# Patient Record
Sex: Female | Born: 1960 | Race: White | Hispanic: No | Marital: Single | Smoking: Current every day smoker
Health system: Southern US, Community
[De-identification: ages and names within clinical notes are randomized; demographics above are authoritative.]

## PROBLEM LIST (undated history)

## (undated) DIAGNOSIS — I10 Essential (primary) hypertension: Secondary | ICD-10-CM

## (undated) DIAGNOSIS — I82409 Acute embolism and thrombosis of unspecified deep veins of unspecified lower extremity: Secondary | ICD-10-CM

## (undated) DIAGNOSIS — M5416 Radiculopathy, lumbar region: Secondary | ICD-10-CM

## (undated) DIAGNOSIS — J449 Chronic obstructive pulmonary disease, unspecified: Secondary | ICD-10-CM

## (undated) DIAGNOSIS — M549 Dorsalgia, unspecified: Secondary | ICD-10-CM

## (undated) DIAGNOSIS — K047 Periapical abscess without sinus: Secondary | ICD-10-CM

## (undated) DIAGNOSIS — M1990 Unspecified osteoarthritis, unspecified site: Secondary | ICD-10-CM

## (undated) DIAGNOSIS — E119 Type 2 diabetes mellitus without complications: Secondary | ICD-10-CM

## (undated) DIAGNOSIS — G8929 Other chronic pain: Secondary | ICD-10-CM

## (undated) DIAGNOSIS — J45909 Unspecified asthma, uncomplicated: Secondary | ICD-10-CM

## (undated) DIAGNOSIS — F4321 Adjustment disorder with depressed mood: Secondary | ICD-10-CM

## (undated) DIAGNOSIS — E785 Hyperlipidemia, unspecified: Secondary | ICD-10-CM

## (undated) DIAGNOSIS — G4733 Obstructive sleep apnea (adult) (pediatric): Secondary | ICD-10-CM

## (undated) DIAGNOSIS — G709 Myoneural disorder, unspecified: Secondary | ICD-10-CM

## (undated) HISTORY — PX: FRACTURE SURGERY: SHX138

## (undated) HISTORY — DX: Acute embolism and thrombosis of unspecified deep veins of unspecified lower extremity: I82.409

## (undated) HISTORY — DX: Chronic obstructive pulmonary disease, unspecified: J44.9

## (undated) HISTORY — DX: Unspecified asthma, uncomplicated: J45.909

## (undated) HISTORY — DX: Obstructive sleep apnea (adult) (pediatric): G47.33

## (undated) HISTORY — PX: EXPLORATORY LAPAROTOMY: SUR591

## (undated) HISTORY — DX: Hyperlipidemia, unspecified: E78.5

## (undated) HISTORY — DX: Myoneural disorder, unspecified: G70.9

## (undated) HISTORY — DX: Adjustment disorder with depressed mood: F43.21

## (undated) HISTORY — DX: Type 2 diabetes mellitus without complications: E11.9

## (undated) HISTORY — PX: SPINE SURGERY: SHX786

## (undated) HISTORY — DX: Unspecified osteoarthritis, unspecified site: M19.90

## (undated) HISTORY — DX: Essential (primary) hypertension: I10

---

## 2008-11-27 HISTORY — PX: SHOULDER SURGERY: SHX246

## 2013-11-27 HISTORY — PX: BACK SURGERY: SHX140

## 2017-07-10 ENCOUNTER — Ambulatory Visit: Payer: Self-pay | Admitting: Family Medicine

## 2017-09-07 ENCOUNTER — Ambulatory Visit: Payer: Self-pay | Admitting: Family Medicine

## 2017-10-04 ENCOUNTER — Other Ambulatory Visit: Payer: Self-pay

## 2017-10-04 ENCOUNTER — Other Ambulatory Visit: Payer: Self-pay | Admitting: Family Medicine

## 2017-10-04 ENCOUNTER — Encounter: Payer: Self-pay | Admitting: Family Medicine

## 2017-10-04 ENCOUNTER — Ambulatory Visit (INDEPENDENT_AMBULATORY_CARE_PROVIDER_SITE_OTHER): Payer: Medicare Other | Admitting: Family Medicine

## 2017-10-04 VITALS — BP 120/82 | HR 72 | Temp 98.5°F | Resp 18 | Ht 66.0 in | Wt 243.0 lb

## 2017-10-04 DIAGNOSIS — F4321 Adjustment disorder with depressed mood: Secondary | ICD-10-CM

## 2017-10-04 DIAGNOSIS — M47816 Spondylosis without myelopathy or radiculopathy, lumbar region: Secondary | ICD-10-CM | POA: Insufficient documentation

## 2017-10-04 DIAGNOSIS — Z23 Encounter for immunization: Secondary | ICD-10-CM

## 2017-10-04 DIAGNOSIS — R7303 Prediabetes: Secondary | ICD-10-CM | POA: Diagnosis not present

## 2017-10-04 DIAGNOSIS — E785 Hyperlipidemia, unspecified: Secondary | ICD-10-CM | POA: Insufficient documentation

## 2017-10-04 DIAGNOSIS — E66812 Obesity, class 2: Secondary | ICD-10-CM | POA: Insufficient documentation

## 2017-10-04 DIAGNOSIS — Z6839 Body mass index (BMI) 39.0-39.9, adult: Secondary | ICD-10-CM | POA: Diagnosis not present

## 2017-10-04 DIAGNOSIS — IMO0002 Reserved for concepts with insufficient information to code with codable children: Secondary | ICD-10-CM | POA: Insufficient documentation

## 2017-10-04 DIAGNOSIS — M5416 Radiculopathy, lumbar region: Secondary | ICD-10-CM | POA: Diagnosis not present

## 2017-10-04 DIAGNOSIS — M4726 Other spondylosis with radiculopathy, lumbar region: Secondary | ICD-10-CM

## 2017-10-04 DIAGNOSIS — I10 Essential (primary) hypertension: Secondary | ICD-10-CM

## 2017-10-04 DIAGNOSIS — E559 Vitamin D deficiency, unspecified: Secondary | ICD-10-CM

## 2017-10-04 DIAGNOSIS — Z720 Tobacco use: Secondary | ICD-10-CM | POA: Insufficient documentation

## 2017-10-04 DIAGNOSIS — Z86718 Personal history of other venous thrombosis and embolism: Secondary | ICD-10-CM

## 2017-10-04 DIAGNOSIS — G8929 Other chronic pain: Secondary | ICD-10-CM

## 2017-10-04 DIAGNOSIS — E1165 Type 2 diabetes mellitus with hyperglycemia: Secondary | ICD-10-CM | POA: Insufficient documentation

## 2017-10-04 DIAGNOSIS — E6609 Other obesity due to excess calories: Secondary | ICD-10-CM | POA: Insufficient documentation

## 2017-10-04 HISTORY — DX: Adjustment disorder with depressed mood: F43.21

## 2017-10-04 MED ORDER — DULOXETINE HCL 60 MG PO CPEP: 60.0000 mg | ORAL_CAPSULE | Freq: Every day | ORAL | 2 refills | Status: DC

## 2017-10-04 MED ORDER — TIZANIDINE HCL 4 MG PO CAPS: 4.0000 mg | ORAL_CAPSULE | Freq: Three times a day (TID) | ORAL | 1 refills | Status: DC

## 2017-10-04 MED ORDER — LOSARTAN POTASSIUM 100 MG PO TABS: 100.0000 mg | ORAL_TABLET | Freq: Every day | ORAL | 2 refills | Status: DC

## 2017-10-04 MED ORDER — PREGABALIN 100 MG PO CAPS: 100.0000 mg | ORAL_CAPSULE | Freq: Three times a day (TID) | ORAL | 2 refills | Status: DC

## 2017-10-04 MED ORDER — RIVAROXABAN 20 MG PO TABS: 20.0000 mg | ORAL_TABLET | Freq: Every day | ORAL | 2 refills | Status: DC

## 2017-10-04 NOTE — Patient Instructions (Addendum)
Need old records from PCP:  Dr Chauncey Cruel. [facility] in [place] Need old records from [place] [facility]  Need blood work today I will send you a letter with your test results.  If there is anything of concern, we will call right away.  I have refilled your usual medicines We will refer you to a pain specialist

## 2017-10-04 NOTE — Progress Notes (Signed)
Chief Complaint  Patient presents with  . Hypertension   Patient is new to establish. She is out of all of her medications. She states she was previously diagnosed as diabetic.  She took metformin for a while.  She thinks it made her feel bad.  She stopped her metformin on her own.  She denies polydipsia or polyuria. She states that she has hypertension.  She is out of her Cozaar.  Her blood pressure has been fairly well controlled. She has chronic pain.  She was on oxycodone, tizanidine, and Lyrica on a daily basis.  She has not had her pain medication for couple of months.  She is requesting a refill today.  She is informed that I do not do chronic narcotic pain management and would refer her for this.  She states that she has chronic back pain from having surgery, postoperatively she developed infection at the surgical site in her low back, and had to have IV antibiotics for months.  She states that because of this her operation was not successful she still has back pain and leg pain. Patient is obese.  She feels unable to exercise because of her back pain. 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  She was worked up for cancer 2 years ago and this was negative.  Her first DVT was in her leg, the second 1 was on her arm.  She is on lifetime Xarelto because of this.  Because of the Xarelto she cannot take anti-inflammatory medications. I have discussed the multiple health risks associated with cigarette smoking including, but not limited to, cardiovascular disease, lung disease and cancer.  I have strongly recommended that smoking be stopped.  I have reviewed the various methods of quitting including cold [place], classes, nicotine replacements and prescription medications.  I have offered assistance in this difficult process.  The patient is not interested in assistance at this time.  She believes that she has failed all treatment options that have been offered to her in the past, was intolerant of Chantix,  and none of the patches gum or nicotine replacement have helped her.  She is uncertain if she is up-to-date with her health care maintenance.  She states she had a colonoscopy a couple years ago.  I will request her old records.  She agrees to getting a flu shot today.  She believes she has had a pneumonia shot in the past.  Patient Active Problem List   Diagnosis Date Noted  . Tobacco abuse 10/04/2017  . Situational depression 10/04/2017  . HTN (hypertension) 10/04/2017  . HLD (hyperlipidemia) 10/04/2017  . Prediabetes 10/04/2017  . Class 2 obesity due to excess calories with body mass index (BMI) of 39.0 to 39.9 in adult 10/04/2017  . Chronic radicular low back pain 10/04/2017  . 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 History of DVT of lower extremity 10/04/2017    Outpatient Encounter Medications as of 10/04/2017  Medication Sig  . DULoxetine (CYMBALTA) 60 MG capsule Take 1 capsule (60 mg total) daily by mouth.  . losartan (COZAAR) 100 MG tablet Take 1 tablet (100 mg total) daily by mouth.  . Oxycodone HCl 10 MG TABS Take 10 mg 4 (four) times daily by mouth.  . pregabalin (LYRICA) 100 MG capsule Take 1 capsule (100 mg total) 3 (three) times daily by mouth.  . rivaroxaban (XARELTO) 20 MG TABS tablet Take 1 tablet (20 mg total) daily with supper by mouth.  Marland Kitchen tiZANidine (  ZANAFLEX) 4 MG capsule Take 1 capsule (4 mg total) 3 (three) times daily by mouth.   No facility-administered encounter medications on file as of 10/04/2017.     Past Medical History:  Diagnosis Date  . Arthritis   . Asthma   . COPD (chronic obstructive pulmonary disease) ([place])   . Diabetes mellitus without complication ([place])   . DVT, lower extremity ([place])   . Hyperlipidemia   . Hypertension   . Neuromuscular disorder ([place])   . Situational depression 10/04/2017    Past Surgical History:  Procedure Laterality Date  . BACK SURGERY  2015  . EXPLORATORY LAPAROTOMY    . FRACTURE SURGERY     . SHOULDER SURGERY  2010   right  . SPINE SURGERY     LUMBAR SPINE    Social History   Socioeconomic History  . Marital status: Soil scientist    Spouse name: tim  . Number of children: 1  . Years of education: 43  . Highest education level: Not on file  Social Needs  . Financial resource strain: Somewhat hard  . Food insecurity - worry: Sometimes true  . Food insecurity - inability: Never true  . Transportation needs - medical: Yes  . Transportation needs - non-medical: Yes  Occupational History  . Occupation: disability    Comment: low back  Tobacco Use  . Smoking status: Current Every Day Smoker    Packs/day: 1.00    Types: Cigarettes    Start date: 11/27/1978  . Smokeless tobacco: Never Used  Substance and Sexual Activity  . Alcohol use: No    Frequency: Never  . Drug use: No  . Sexual activity: Not Currently  Other Topics Concern  . Not on file  Social History Narrative   Lives with Octavia Bruckner and disabled son Marylyn Ishihara   Chronic pain disorder after back surgery    Family History  Problem Relation Age of Onset  . Alcohol abuse Mother   . Arthritis Mother   . Mental illness Mother   . Diabetes Father   . Heart disease Father 51  . Heart disease Brother 1  . Learning disabilities Son   . Tuberous sclerosis Son     Review of Systems  Constitutional: Positive for malaise/fatigue. Negative for chills, fever and weight loss.  HENT: Negative for congestion and hearing loss.   Eyes: Negative for blurred vision and pain.  Respiratory: Positive for cough and shortness of breath.   Cardiovascular: Negative for chest pain and leg swelling.  Gastrointestinal: Negative for abdominal pain, constipation, diarrhea and heartburn.  Genitourinary: Negative for dysuria and frequency.  Musculoskeletal: Positive for back pain, joint pain, myalgias and neck pain. Negative for falls.  Neurological: Positive for weakness. Negative for dizziness, seizures and headaches.    Endo/Heme/Allergies: Negative for polydipsia.  Psychiatric/Behavioral: Positive for depression. The patient is nervous/anxious and has insomnia.     BP 120/82 (BP Location: Right Arm, Patient Position: Sitting, Cuff Size: Normal)   Pulse 72   Temp 98.5 F (36.9 C) (Temporal)   Resp 18   Ht 5\' 6"  (1.676 m)   Wt 243 lb 0.6 oz (110.2 kg)   SpO2 99%   BMI 39.23 kg/m   Physical Exam  Constitutional: She is oriented to person, place, and time. She appears well-developed and well-nourished.  Obese.  Very demonstrative with frequent position changes, and vocalization due to back pain  HENT:  Head: Normocephalic and atraumatic.  Mouth/Throat: Oropharynx is clear and moist.  Eyes: Conjunctivae  are normal. Pupils are equal, round, and reactive to light.  Neck: Normal range of motion. Neck supple. No thyromegaly present.  Cardiovascular: Normal rate, regular rhythm and normal heart sounds.  Pulmonary/Chest: Effort normal and breath sounds normal. No respiratory distress.  Abdominal: Soft. Bowel sounds are normal.  Musculoskeletal: Normal range of motion. She exhibits no edema.  Antalgic gait.  Stiff movements.  Slightly flexed posture.  Refuses range of motion testing.  She can stand on heels and toes.  She pulls away and vocalizes with even light touch throughout the low back muscle and bony structures.  No pain with axial loading.  Pain with passive movement.  Lymphadenopathy:    She has no cervical adenopathy.  Neurological: She is alert and oriented to person, place, and time. She displays normal reflexes. Coordination normal.  Gait normal  Skin: Skin is warm and dry.  Psychiatric: She has a normal mood and affect. Thought content normal.  Evidence of symptom magnification  Nursing note and vitals reviewed. ASSESSMENT/PLAN:  1. Tobacco abuse Advised smoking cessation  2. Situational depression Refilled duloxetine  3. Essential hypertension Refilled Cozaar  4. Hyperlipidemia,  unspecified hyperlipidemia type Refilled atorvastatin - Lipid panel  5. Prediabetes We will check laboratory work and restart the metformin if needed - CBC - COMPLETE METABOLIC PANEL WITH GFR - Hemoglobin A1c - Urinalysis, Routine w reflex microscopic  6. Class 2 severe obesity due to excess calories with serious comorbidity and body mass index (BMI) of 39.0 to 39.9 in adult [facility]) Discussed importance of diet and exercise to improve her weight, and take strain off of her back.  This will also help her blood pressure and diabetes conditions  7. Chronic radicular low back pain Refer to pain specialty  8. Osteoarthritis of spine with radiculopathy, lumbar region History  9. History of DVT of lower extremity Refill Xarelto  10. Needs flu shot Done - Flu Vaccine QUAD 36+ mos IM  11. Vitamin D deficiency - VITAMIN D 25 Hydroxy (Vit-D Deficiency, Fractures)  12. Need for pneumococcal vaccination Done - Pneumococcal conjugate vaccine 13-valent IM   Patient Instructions  Need old records from PCP:  Dr Chauncey Cruel. [place] in [place] Need old records from [facility]  Need blood work today I will send you a letter with your test results.  If there is anything of concern, we will call right away.  I have refilled your usual medicines We will refer you to a pain specialist    Raylene Everts, MD

## 2017-10-09 ENCOUNTER — Encounter: Payer: Self-pay | Admitting: Family Medicine

## 2017-10-09 ENCOUNTER — Other Ambulatory Visit: Payer: Self-pay | Admitting: Family Medicine

## 2017-10-09 ENCOUNTER — Telehealth: Payer: Self-pay | Admitting: Family Medicine

## 2017-10-09 LAB — CBC
HEMATOCRIT: 35.3 % (ref 35.0–45.0)
HEMOGLOBIN: 11.3 g/dL — AB (ref 11.7–15.5)
MCH: 25.5 pg — ABNORMAL LOW (ref 27.0–33.0)
MCHC: 32 g/dL (ref 32.0–36.0)
MCV: 79.5 fL — ABNORMAL LOW (ref 80.0–100.0)
MPV: 10.1 fL (ref 7.5–12.5)
Platelets: 417 10*3/uL — ABNORMAL HIGH (ref 140–400)
RBC: 4.44 10*6/uL (ref 3.80–5.10)
RDW: 16.4 % — ABNORMAL HIGH (ref 11.0–15.0)
WBC: 8.5 10*3/uL (ref 3.8–10.8)

## 2017-10-09 LAB — HEMOGLOBIN A1C
HEMOGLOBIN A1C: 11.6 %{Hb} — AB (ref <5.7)
Mean Plasma Glucose: 286 (calc)
eAG (mmol/L): 15.9 (calc)

## 2017-10-09 LAB — URINALYSIS, ROUTINE W REFLEX MICROSCOPIC
BILIRUBIN URINE: NEGATIVE
Bacteria, UA: NONE SEEN /HPF
HGB URINE DIPSTICK: NEGATIVE
Hyaline Cast: NONE SEEN /LPF
KETONES UR: NEGATIVE
LEUKOCYTES UA: NEGATIVE
NITRITE: NEGATIVE
RBC / HPF: NONE SEEN /HPF (ref 0–2)
Specific Gravity, Urine: 1.02 (ref 1.001–1.03)
pH: <=5 (ref 5.0–8.0)

## 2017-10-09 LAB — COMPLETE METABOLIC PANEL WITH GFR
AG RATIO: 0.8 (calc) — AB (ref 1.0–2.5)
ALKALINE PHOSPHATASE (APISO): 91 U/L (ref 33–130)
ALT: 7 U/L (ref 6–29)
AST: 11 U/L (ref 10–35)
Albumin: 3.4 g/dL — ABNORMAL LOW (ref 3.6–5.1)
BUN: 8 mg/dL (ref 7–25)
CO2: 24 mmol/L (ref 20–32)
Calcium: 8.8 mg/dL (ref 8.6–10.4)
Chloride: 102 mmol/L (ref 98–110)
Creat: 0.53 mg/dL (ref 0.50–1.05)
GFR, Est African American: 125 mL/min/{1.73_m2} (ref >=60)
GFR, Est Non African American: 108 mL/min/{1.73_m2} (ref >=60)
GLOBULIN: 4.1 g/dL — AB (ref 1.9–3.7)
Glucose, Bld: 340 mg/dL — ABNORMAL HIGH (ref 65–139)
Potassium: 4.3 mmol/L (ref 3.5–5.3)
SODIUM: 136 mmol/L (ref 135–146)
Total Bilirubin: 0.3 mg/dL (ref 0.2–1.2)
Total Protein: 7.5 g/dL (ref 6.1–8.1)

## 2017-10-09 LAB — LIPID PANEL
Cholesterol: 183 mg/dL (ref <200)
HDL: 28 mg/dL — ABNORMAL LOW (ref >50)
LDL Cholesterol (Calc): 119 mg/dL (calc) — ABNORMAL HIGH
NON-HDL CHOLESTEROL (CALC): 155 mg/dL — AB (ref <130)
TRIGLYCERIDES: 249 mg/dL — AB (ref <150)
Total CHOL/HDL Ratio: 6.5 (calc) — ABNORMAL HIGH (ref <5.0)

## 2017-10-09 LAB — VITAMIN D 25 HYDROXY (VIT D DEFICIENCY, FRACTURES): Vit D, 25-Hydroxy: 9 ng/mL — ABNORMAL LOW (ref 30–100)

## 2017-10-09 MED ORDER — ATORVASTATIN CALCIUM 40 MG PO TABS: 40.0000 mg | ORAL_TABLET | Freq: Every day | ORAL | 3 refills | Status: DC

## 2017-10-09 MED ORDER — METFORMIN HCL ER 750 MG PO TB24: 750.0000 mg | ORAL_TABLET | Freq: Two times a day (BID) | ORAL | 6 refills | Status: DC

## 2017-10-09 MED ORDER — VITAMIN D (ERGOCALCIFEROL) 1.25 MG (50000 UNIT) PO CAPS: 50000.0000 [IU] | ORAL_CAPSULE | ORAL | 0 refills | Status: DC

## 2017-10-09 NOTE — Telephone Encounter (Signed)
PATIENT CALLED IN THIS MORNING STATING THAT THERE WAS A MIXUP WITH HER LABS, SHE IS REQUESTING AN UPDATE ON THE MEDICATION REQUEST FOR MUSCLE RELAXER. CB# [phone]

## 2017-10-09 NOTE — Progress Notes (Signed)
Called and spoke to Linda Gonzalez, aware of lab results and new meds.

## 2017-10-09 NOTE — Telephone Encounter (Signed)
Done

## 2017-10-09 NOTE — Telephone Encounter (Signed)
-----   Message from Raylene Everts, MD sent at 10/09/2017  9:37 AM EST ----- Call Rodena Piety.  Diabetes is significant.  Needs metformin BID and lipitor.  I have sent to pharmacy.  Needs to modify diet - STOP SWEETS.  See me as scheduled.  Letter sent

## 2017-10-10 ENCOUNTER — Encounter: Payer: Self-pay | Admitting: Family Medicine

## 2017-10-10 ENCOUNTER — Other Ambulatory Visit: Payer: Self-pay | Admitting: *Deleted

## 2017-10-10 DIAGNOSIS — K2950 Unspecified chronic gastritis without bleeding: Secondary | ICD-10-CM | POA: Insufficient documentation

## 2017-10-10 DIAGNOSIS — E559 Vitamin D deficiency, unspecified: Secondary | ICD-10-CM | POA: Insufficient documentation

## 2017-10-10 DIAGNOSIS — D126 Benign neoplasm of colon, unspecified: Secondary | ICD-10-CM | POA: Insufficient documentation

## 2017-10-10 DIAGNOSIS — G4733 Obstructive sleep apnea (adult) (pediatric): Secondary | ICD-10-CM | POA: Insufficient documentation

## 2017-10-10 HISTORY — DX: Obstructive sleep apnea (adult) (pediatric): G47.33

## 2017-10-10 MED ORDER — CYCLOBENZAPRINE HCL 5 MG PO TABS: 5.0000 mg | ORAL_TABLET | Freq: Three times a day (TID) | ORAL | 0 refills | Status: DC | PRN

## 2017-10-10 MED ORDER — METHOCARBAMOL 500 MG PO TABS: 500.0000 mg | ORAL_TABLET | Freq: Three times a day (TID) | ORAL | 0 refills | Status: DC | PRN

## 2017-10-10 NOTE — Telephone Encounter (Signed)
As we discussed yesterday, robaxin 500 TID give her #[phone]

## 2017-10-10 NOTE — Telephone Encounter (Signed)
Patient called wanting to know why she is on vitamin D and her muscle relaxer called in. Please advise [phone].

## 2017-10-10 NOTE — Telephone Encounter (Signed)
What muscle relaxer do you want her to have.

## 2017-10-10 NOTE — Telephone Encounter (Signed)
Spoke to patient. Expalined why she had vit d supplement called in. She thinks Robaxin is being denied by insurance.

## 2017-11-02 ENCOUNTER — Other Ambulatory Visit: Payer: Self-pay

## 2017-11-02 ENCOUNTER — Encounter: Payer: Self-pay | Admitting: Family Medicine

## 2017-11-02 ENCOUNTER — Ambulatory Visit (INDEPENDENT_AMBULATORY_CARE_PROVIDER_SITE_OTHER): Payer: Medicare Other | Admitting: Family Medicine

## 2017-11-02 VITALS — BP 124/86 | HR 92 | Temp 96.5°F | Resp 18 | Ht 66.0 in | Wt 246.1 lb

## 2017-11-02 DIAGNOSIS — F4321 Adjustment disorder with depressed mood: Secondary | ICD-10-CM | POA: Diagnosis not present

## 2017-11-02 DIAGNOSIS — Z1159 Encounter for screening for other viral diseases: Secondary | ICD-10-CM | POA: Diagnosis not present

## 2017-11-02 DIAGNOSIS — I10 Essential (primary) hypertension: Secondary | ICD-10-CM | POA: Diagnosis not present

## 2017-11-02 DIAGNOSIS — Z114 Encounter for screening for human immunodeficiency virus [HIV]: Secondary | ICD-10-CM | POA: Diagnosis not present

## 2017-11-02 DIAGNOSIS — E785 Hyperlipidemia, unspecified: Secondary | ICD-10-CM | POA: Diagnosis not present

## 2017-11-02 DIAGNOSIS — G8929 Other chronic pain: Secondary | ICD-10-CM | POA: Diagnosis not present

## 2017-11-02 DIAGNOSIS — M5416 Radiculopathy, lumbar region: Secondary | ICD-10-CM | POA: Diagnosis not present

## 2017-11-02 DIAGNOSIS — E1165 Type 2 diabetes mellitus with hyperglycemia: Secondary | ICD-10-CM

## 2017-11-02 MED ORDER — CYCLOBENZAPRINE HCL 5 MG PO TABS: 5.0000 mg | ORAL_TABLET | Freq: Three times a day (TID) | ORAL | 0 refills | Status: DC | PRN

## 2017-11-02 MED ORDER — BLOOD GLUCOSE MONITOR KIT: PACK | 0 refills | Status: DC

## 2017-11-02 NOTE — Patient Instructions (Addendum)
Continue to try to eat well. Exercise every day that you are able. Continue current medications. Need lab work in February Come see me after lab testing  I have placed referral to pain clinic.  We will try to get you in at the earliest available appointment  Try to quit smoking  Diabetes Mellitus and Food It is important for you to manage your blood sugar (glucose) level. Your blood glucose level can be greatly affected by what you eat. Eating healthier foods in the appropriate amounts throughout the day at about the same time each day will help you control your blood glucose level. It can also help slow or prevent worsening of your diabetes mellitus. Healthy eating may even help you improve the level of your blood pressure and reach or maintain a healthy weight. General recommendations for healthful eating and cooking habits include:  Eating meals and snacks regularly. Avoid going long periods of time without eating to lose weight.  Eating a diet that consists mainly of plant-based foods, such as fruits, vegetables, nuts, legumes, and whole grains.  Using low-heat cooking methods, such as baking, instead of high-heat cooking methods, such as deep frying.  Work with your dietitian to make sure you understand how to use the Nutrition Facts information on food labels. How can food affect me? Carbohydrates Carbohydrates affect your blood glucose level more than any other type of food. Your dietitian will help you determine how many carbohydrates to eat at each meal and teach you how to count carbohydrates. Counting carbohydrates is important to keep your blood glucose at a healthy level, especially if you are using insulin or taking certain medicines for diabetes mellitus. Alcohol Alcohol can cause sudden decreases in blood glucose (hypoglycemia), especially if you use insulin or take certain medicines for diabetes mellitus. Hypoglycemia can be a life-threatening condition. Symptoms of  hypoglycemia (sleepiness, dizziness, and disorientation) are similar to symptoms of having too much alcohol. If your health care provider has given you approval to drink alcohol, do so in moderation and use the following guidelines:  Women should not have more than one drink per day, and men should not have more than two drinks per day. One drink is equal to: ? 12 oz of beer. ? 5 oz of wine. ? 1 oz of hard liquor.  Do not drink on an empty stomach.  Keep yourself hydrated. Have water, diet soda, or unsweetened iced tea.  Regular soda, juice, and other mixers might contain a lot of carbohydrates and should be counted.  What foods are not recommended? As you make food choices, it is important to remember that all foods are not the same. Some foods have fewer nutrients per serving than other foods, even though they might have the same number of calories or carbohydrates. It is difficult to get your body what it needs when you eat foods with fewer nutrients. Examples of foods that you should avoid that are high in calories and carbohydrates but low in nutrients include:  Trans fats (most processed foods list trans fats on the Nutrition Facts label).  Regular soda.  Juice.  Candy.  Sweets, such as cake, pie, doughnuts, and cookies.  Fried foods.  What foods can I eat? Eat nutrient-rich foods, which will nourish your body and keep you healthy. The food you should eat also will depend on several factors, including:  The calories you need.  The medicines you take.  Your weight.  Your blood glucose level.  Your blood pressure level.  Your cholesterol level.  You should eat a variety of foods, including:  Protein. ? Lean cuts of meat. ? Proteins low in saturated fats, such as fish, egg whites, and beans. Avoid processed meats.  Fruits and vegetables. ? Fruits and vegetables that may help control blood glucose levels, such as apples, mangoes, and yams.  Dairy  products. ? Choose fat-free or low-fat dairy products, such as milk, yogurt, and cheese.  Grains, bread, pasta, and rice. ? Choose whole grain products, such as multigrain bread, whole oats, and brown rice. These foods may help control blood pressure.  Fats. ? Foods containing healthful fats, such as nuts, avocado, olive oil, canola oil, and fish.  Does everyone with diabetes mellitus have the same meal plan? Because every person with diabetes mellitus is different, there is not one meal plan that works for everyone. It is very important that you meet with a dietitian who will help you create a meal plan that is just right for you. This information is not intended to replace advice given to you by your health care provider. Make sure you discuss any questions you have with your health care provider. Document Released: 08/10/2005 Document Revised: 04/20/2016 Document Reviewed: 10/10/2013 Elsevier Interactive Patient Education  2017 [employer].

## 2017-11-02 NOTE — Progress Notes (Signed)
Chief Complaint  Patient presents with  . Diabetes   Is patient is here for one-month follow-up.  She was new last month.  I received partial old records.  She has not up-to-date with health maintenance. She requests again a pain medicine referral.  I gave her a list last time to call.  She states she needs to be seen ASAP.  I will place a referral in the chart, in addition to the list. She had blood work at her last visit.  She has uncontrolled diabetes with an A1c over 11.  She did not tell me she was diabetic at her first visit.  I sent metformin for her to take, and she is taking 750 mg twice daily.  I gave her metformin XR because she does not tolerate the regular.  She is tolerating this.  She feels better with decreased urination, decreased thirst, and decreased nocturia. Her blood pressure is well controlled on medication. She is back taking her statin. She was found to be vitamin D deficient and is taking a vitamin D supplement. She takes duloxetine for pain and depression.  She has been more depressed lately.  She is tearful at the visit today.  She states that she always gets stressed during Christmas.  Patient Active Problem List   Diagnosis Date Noted  . Vitamin D deficiency 10/10/2017  . OSA (obstructive sleep apnea) 10/10/2017  . Chronic gastritis 10/10/2017  . Tubular adenoma of colon 10/10/2017  . Tobacco abuse 10/04/2017  . Situational depression 10/04/2017  . HTN (hypertension) 10/04/2017  . HLD (hyperlipidemia) 10/04/2017  . Uncontrolled type 2 diabetes mellitus ([place]) 10/04/2017  . Class 2 obesity due to excess calories with body mass index (BMI) of 39.0 to 39.9 in adult 10/04/2017  . Chronic radicular low back pain 10/04/2017  . 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 History of DVT of lower extremity 10/04/2017    Outpatient Encounter Medications as of 11/02/2017  Medication Sig  . atorvastatin (LIPITOR) 40 MG tablet Take 1 tablet (40  mg total) daily by mouth.  . cyclobenzaprine (FLEXERIL) 5 MG tablet Take 1 tablet (5 mg total) by mouth 3 (three) times daily as needed for muscle spasms.  . DULoxetine (CYMBALTA) 60 MG capsule Take 1 capsule (60 mg total) daily by mouth.  . losartan (COZAAR) 100 MG tablet Take 1 tablet (100 mg total) daily by mouth.  . metFORMIN (GLUCOPHAGE XR) 750 MG 24 hr tablet Take 1 tablet (750 mg total) 2 (two) times daily by mouth.  . pregabalin (LYRICA) 100 MG capsule Take 1 capsule (100 mg total) 3 (three) times daily by mouth.  . rivaroxaban (XARELTO) 20 MG TABS tablet Take 1 tablet (20 mg total) daily with supper by mouth.  Marland Kitchen tiZANidine (ZANAFLEX) 4 MG capsule Take 1 capsule (4 mg total) 3 (three) times daily by mouth.  . Vitamin D, Ergocalciferol, (DRISDOL) 50000 units CAPS capsule Take 1 capsule (50,000 Units total) every 7 (seven) days by mouth.  . [DISCONTINUED] cyclobenzaprine (FLEXERIL) 5 MG tablet Take 1 tablet (5 mg total) 3 (three) times daily as needed by mouth for muscle spasms.  . blood glucose meter kit and supplies KIT Dispense based on patient and insurance preference. Use up to four times daily as directed. (FOR ICD-9 250.00, 250.01).  . [DISCONTINUED] methocarbamol (ROBAXIN) 500 MG tablet Take 1 tablet (500 mg total) every 8 (eight) hours as needed by mouth for muscle spasms. (Patient not taking: Reported on 11/02/2017)  . [  DISCONTINUED] Oxycodone HCl 10 MG TABS Take 10 mg 4 (four) times daily by mouth.   No facility-administered encounter medications on file as of 11/02/2017.     Allergies  Allergen Reactions  . Ace Inhibitors Cough  . Repaglinide Nausea And Vomiting  . Vancomycin Rash    Review of Systems  Constitutional: Negative for activity change, appetite change and unexpected weight change.  HENT: Negative for congestion, dental problem, postnasal drip and rhinorrhea.   Eyes: Negative for redness and visual disturbance.  Respiratory: Negative for cough and shortness of  breath.   Cardiovascular: Negative for chest pain, palpitations and leg swelling.  Gastrointestinal: Negative for abdominal pain, constipation and diarrhea.  Endocrine: Positive for polydipsia and polyuria.       Improved  Genitourinary: Negative for difficulty urinating, frequency and vaginal bleeding.  Musculoskeletal: Positive for back pain. Negative for arthralgias.  Neurological: Negative for dizziness and headaches.  Psychiatric/Behavioral: Positive for dysphoric mood. Negative for sleep disturbance and suicidal ideas. The patient is nervous/anxious.     BP 124/86 (BP Location: Right Arm, Patient Position: Sitting, Cuff Size: Large)   Pulse 92   Temp (!) 96.5 F (35.8 C) (Temporal)   Resp 18   Ht 5' 6"  (1.676 m)   Wt 246 lb 1.9 oz (111.6 kg)   LMP 11/02/2012 (Approximate)   SpO2 100%   BMI 39.72 kg/m   Physical Exam  Constitutional: She is oriented to person, place, and time. She appears well-developed and well-nourished.  Obese.  Very demonstrative with frequent position changes, and vocalization due to back pain  HENT:  Head: Normocephalic and atraumatic.  Mouth/Throat: Oropharynx is clear and moist.  Eyes: Conjunctivae are normal. Pupils are equal, round, and reactive to light.  Neck: Normal range of motion. Neck supple.  Cardiovascular: Normal rate, regular rhythm and normal heart sounds.  Pulmonary/Chest: Effort normal and breath sounds normal. No respiratory distress.  Musculoskeletal: Normal range of motion. She exhibits no edema.  Antalgic gait.  Stiff movements.    Neurological: She is alert and oriented to person, place, and time. She displays normal reflexes. Coordination normal.  Gait normal  Skin: Skin is warm and dry.  Psychiatric: She has a normal mood and affect. Thought content normal.  Evidence of symptom magnification  Nursing note and vitals reviewed.   ASSESSMENT/PLAN:  1. Situational depression Not well controlled on Cymbalta.  Offered psych  referral.  Patient declines  2. Essential hypertension Controlled on medication  3. Hyperlipidemia, unspecified hyperlipidemia type Controlled at the initial visit, back on statin  4. Uncontrolled type 2 diabetes mellitus with hyperglycemia ([place]) Discussed referral to dietitian.  Patient declines.  Discussed referral to endocrinology.  Patient declines. - CBC - COMPLETE METABOLIC PANEL WITH GFR - Hemoglobin A1c - Lipid panel - Microalbumin / creatinine urine ratio - Urinalysis, Routine w reflex microscopic - Hepatitis C Antibody - HIV antibody (with reflex)  5. Chronic radicular low back pain Referred - Ambulatory referral to Pain Clinic  6. Encounter for hepatitis C screening test for low risk patient Discussed, ordered - Hepatitis C Antibody  7. Screening for HIV (human immunodeficiency virus) Offered, ordered - HIV antibody (with reflex)   Patient Instructions  Continue to try to eat well. Exercise every day that you are able. Continue current medications. Need lab work in February Come see me after lab testing  I have placed referral to pain clinic.  We will try to get you in at the earliest available appointment  Try to  quit smoking  Diabetes Mellitus and Food It is important for you to manage your blood sugar (glucose) level. Your blood glucose level can be greatly affected by what you eat. Eating healthier foods in the appropriate amounts throughout the day at about the same time each day will help you control your blood glucose level. It can also help slow or prevent worsening of your diabetes mellitus. Healthy eating may even help you improve the level of your blood pressure and reach or maintain a healthy weight. General recommendations for healthful eating and cooking habits include:  Eating meals and snacks regularly. Avoid going long periods of time without eating to lose weight.  Eating a diet that consists mainly of plant-based foods, such as fruits,  vegetables, nuts, legumes, and whole grains.  Using low-heat cooking methods, such as baking, instead of high-heat cooking methods, such as deep frying.  Work with your dietitian to make sure you understand how to use the Nutrition Facts information on food labels. How can food affect me? Carbohydrates Carbohydrates affect your blood glucose level more than any other type of food. Your dietitian will help you determine how many carbohydrates to eat at each meal and teach you how to count carbohydrates. Counting carbohydrates is important to keep your blood glucose at a healthy level, especially if you are using insulin or taking certain medicines for diabetes mellitus. Alcohol Alcohol can cause sudden decreases in blood glucose (hypoglycemia), especially if you use insulin or take certain medicines for diabetes mellitus. Hypoglycemia can be a life-threatening condition. Symptoms of hypoglycemia (sleepiness, dizziness, and disorientation) are similar to symptoms of having too much alcohol. If your health care provider has given you approval to drink alcohol, do so in moderation and use the following guidelines:  Women should not have more than one drink per day, and men should not have more than two drinks per day. One drink is equal to: ? 12 oz of beer. ? 5 oz of wine. ? 1 oz of hard liquor.  Do not drink on an empty stomach.  Keep yourself hydrated. Have water, diet soda, or unsweetened iced tea.  Regular soda, juice, and other mixers might contain a lot of carbohydrates and should be counted.  What foods are not recommended? As you make food choices, it is important to remember that all foods are not the same. Some foods have fewer nutrients per serving than other foods, even though they might have the same number of calories or carbohydrates. It is difficult to get your body what it needs when you eat foods with fewer nutrients. Examples of foods that you should avoid that are high in  calories and carbohydrates but low in nutrients include:  Trans fats (most processed foods list trans fats on the Nutrition Facts label).  Regular soda.  Juice.  Candy.  Sweets, such as cake, pie, doughnuts, and cookies.  Fried foods.  What foods can I eat? Eat nutrient-rich foods, which will nourish your body and keep you healthy. The food you should eat also will depend on several factors, including:  The calories you need.  The medicines you take.  Your weight.  Your blood glucose level.  Your blood pressure level.  Your cholesterol level.  You should eat a variety of foods, including:  Protein. ? Lean cuts of meat. ? Proteins low in saturated fats, such as fish, egg whites, and beans. Avoid processed meats.  Fruits and vegetables. ? Fruits and vegetables that may help control blood glucose  levels, such as apples, mangoes, and yams.  Dairy products. ? Choose fat-free or low-fat dairy products, such as milk, yogurt, and cheese.  Grains, bread, pasta, and rice. ? Choose whole grain products, such as multigrain bread, whole oats, and brown rice. These foods may help control blood pressure.  Fats. ? Foods containing healthful fats, such as nuts, avocado, olive oil, canola oil, and fish.  Does everyone with diabetes mellitus have the same meal plan? Because every person with diabetes mellitus is different, there is not one meal plan that works for everyone. It is very important that you meet with a dietitian who will help you create a meal plan that is just right for you. This information is not intended to replace advice given to you by your health care provider. Make sure you discuss any questions you have with your health care provider. Document Released: 08/10/2005 Document Revised: 04/20/2016 Document Reviewed: 10/10/2013 Elsevier Interactive Patient Education  2017 Elsevier Inc.    Raylene Everts, MD

## 2017-11-12 ENCOUNTER — Telehealth: Payer: Self-pay | Admitting: Family Medicine

## 2017-11-12 NOTE — Telephone Encounter (Signed)
Called Linda Gonzalez, aware dr Meda Coffee is out of the office until next week.

## 2017-11-12 NOTE — Telephone Encounter (Signed)
It is being prescribed for pain, not depression.  There is no alternative.   Please call

## 2017-11-12 NOTE — Telephone Encounter (Signed)
cymbalta is not covered by insurance, patient is requesting a medication that insurance will pay for.  Pharmacy: [facility] in [place]  Cb#: [phone]

## 2017-11-21 NOTE — Telephone Encounter (Signed)
Called and spoke to [place], her insurance DID pay for the cymbalta.

## 2017-12-06 LAB — URINALYSIS, ROUTINE W REFLEX MICROSCOPIC
BACTERIA UA: NONE SEEN /HPF
Bilirubin Urine: NEGATIVE
HYALINE CAST: NONE SEEN /LPF
Hgb urine dipstick: NEGATIVE
Ketones, ur: NEGATIVE
Leukocytes, UA: NEGATIVE
Nitrite: NEGATIVE
Specific Gravity, Urine: 1.023 (ref 1.001–1.03)
pH: <=5 (ref 5.0–8.0)

## 2017-12-06 LAB — COMPLETE METABOLIC PANEL WITH GFR
AG RATIO: 0.9 (calc) — AB (ref 1.0–2.5)
ALKALINE PHOSPHATASE (APISO): 92 U/L (ref 33–130)
ALT: 9 U/L (ref 6–29)
AST: 11 U/L (ref 10–35)
Albumin: 3.8 g/dL (ref 3.6–5.1)
BUN: 13 mg/dL (ref 7–25)
CO2: 26 mmol/L (ref 20–32)
Calcium: 9.5 mg/dL (ref 8.6–10.4)
Chloride: 99 mmol/L (ref 98–110)
Creat: 0.61 mg/dL (ref 0.50–1.05)
GFR, Est African American: 119 mL/min/{1.73_m2} (ref >=60)
GFR, Est Non African American: 103 mL/min/{1.73_m2} (ref >=60)
Globulin: 4.2 g/dL (calc) — ABNORMAL HIGH (ref 1.9–3.7)
Glucose, Bld: 314 mg/dL — ABNORMAL HIGH (ref 65–99)
POTASSIUM: 4.7 mmol/L (ref 3.5–5.3)
SODIUM: 136 mmol/L (ref 135–146)
Total Bilirubin: 0.5 mg/dL (ref 0.2–1.2)
Total Protein: 8 g/dL (ref 6.1–8.1)

## 2017-12-06 LAB — CBC
HEMATOCRIT: 37.7 % (ref 35.0–45.0)
Hemoglobin: 12.1 g/dL (ref 11.7–15.5)
MCH: 26.1 pg — AB (ref 27.0–33.0)
MCHC: 32.1 g/dL (ref 32.0–36.0)
MCV: 81.3 fL (ref 80.0–100.0)
MPV: 10.2 fL (ref 7.5–12.5)
Platelets: 359 10*3/uL (ref 140–400)
RBC: 4.64 10*6/uL (ref 3.80–5.10)
RDW: 17.2 % — AB (ref 11.0–15.0)
WBC: 10.9 10*3/uL — ABNORMAL HIGH (ref 3.8–10.8)

## 2017-12-06 LAB — HIV ANTIBODY (ROUTINE TESTING W REFLEX): HIV: NONREACTIVE

## 2017-12-06 LAB — HEMOGLOBIN A1C
HEMOGLOBIN A1C: 11.3 %{Hb} — AB (ref <5.7)
MEAN PLASMA GLUCOSE: 278 (calc)
eAG (mmol/L): 15.4 (calc)

## 2017-12-06 LAB — LIPID PANEL
CHOL/HDL RATIO: 4.1 (calc) (ref <5.0)
Cholesterol: 131 mg/dL (ref <200)
HDL: 32 mg/dL — ABNORMAL LOW (ref >50)
LDL Cholesterol (Calc): 73 mg/dL (calc)
NON-HDL CHOLESTEROL (CALC): 99 mg/dL (ref <130)
Triglycerides: 184 mg/dL — ABNORMAL HIGH (ref <150)

## 2017-12-06 LAB — MICROALBUMIN / CREATININE URINE RATIO
CREATININE, URINE: 173 mg/dL (ref 20–275)
Microalb Creat Ratio: 127 mcg/mg creat — ABNORMAL HIGH (ref <30)
Microalb, Ur: 22 mg/dL

## 2017-12-06 LAB — HEPATITIS C ANTIBODY
HEP C AB: NONREACTIVE
SIGNAL TO CUT-OFF: 0.04 (ref <1.00)

## 2017-12-07 ENCOUNTER — Encounter: Payer: Self-pay | Admitting: Family Medicine

## 2017-12-07 DIAGNOSIS — E1165 Type 2 diabetes mellitus with hyperglycemia: Secondary | ICD-10-CM

## 2017-12-13 ENCOUNTER — Ambulatory Visit: Payer: Medicare Other | Admitting: Family Medicine

## 2018-01-03 ENCOUNTER — Ambulatory Visit: Payer: Self-pay | Admitting: "Endocrinology

## 2018-01-09 ENCOUNTER — Other Ambulatory Visit: Payer: Self-pay | Admitting: Family Medicine

## 2018-01-09 NOTE — Telephone Encounter (Signed)
Seen 12 [phone] 

## 2018-01-11 ENCOUNTER — Other Ambulatory Visit: Payer: Self-pay

## 2018-01-30 ENCOUNTER — Encounter (HOSPITAL_COMMUNITY): Payer: Self-pay | Admitting: Emergency Medicine

## 2018-01-30 ENCOUNTER — Emergency Department (HOSPITAL_COMMUNITY)
Admission: EM | Admit: 2018-01-30 | Discharge: 2018-01-30 | Disposition: A | Discharge status: Home / Self Care | Payer: Medicare Other | Attending: Emergency Medicine | Admitting: Emergency Medicine

## 2018-01-30 ENCOUNTER — Other Ambulatory Visit: Payer: Self-pay

## 2018-01-30 ENCOUNTER — Emergency Department (HOSPITAL_COMMUNITY): Payer: Medicare Other

## 2018-01-30 DIAGNOSIS — Z7901 Long term (current) use of anticoagulants: Secondary | ICD-10-CM | POA: Diagnosis not present

## 2018-01-30 DIAGNOSIS — E1165 Type 2 diabetes mellitus with hyperglycemia: Secondary | ICD-10-CM | POA: Diagnosis not present

## 2018-01-30 DIAGNOSIS — I10 Essential (primary) hypertension: Secondary | ICD-10-CM | POA: Diagnosis not present

## 2018-01-30 DIAGNOSIS — M5442 Lumbago with sciatica, left side: Secondary | ICD-10-CM | POA: Insufficient documentation

## 2018-01-30 DIAGNOSIS — Z7984 Long term (current) use of oral hypoglycemic drugs: Secondary | ICD-10-CM | POA: Diagnosis not present

## 2018-01-30 DIAGNOSIS — Z79899 Other long term (current) drug therapy: Secondary | ICD-10-CM | POA: Diagnosis not present

## 2018-01-30 DIAGNOSIS — J449 Chronic obstructive pulmonary disease, unspecified: Secondary | ICD-10-CM | POA: Insufficient documentation

## 2018-01-30 DIAGNOSIS — F17210 Nicotine dependence, cigarettes, uncomplicated: Secondary | ICD-10-CM | POA: Diagnosis not present

## 2018-01-30 DIAGNOSIS — G8929 Other chronic pain: Secondary | ICD-10-CM | POA: Insufficient documentation

## 2018-01-30 DIAGNOSIS — R739 Hyperglycemia, unspecified: Secondary | ICD-10-CM

## 2018-01-30 DIAGNOSIS — M545 Low back pain: Secondary | ICD-10-CM | POA: Diagnosis present

## 2018-01-30 LAB — CBC WITH DIFFERENTIAL/PLATELET
Basophils Absolute: 0 10*3/uL (ref 0.0–0.1)
Basophils Relative: 0 %
EOS ABS: 0.1 10*3/uL (ref 0.0–0.7)
Eosinophils Relative: 1 %
HEMATOCRIT: 36.9 % (ref 36.0–46.0)
HEMOGLOBIN: 11 g/dL — AB (ref 12.0–15.0)
Lymphocytes Relative: 36 %
Lymphs Abs: 3.4 10*3/uL (ref 0.7–4.0)
MCH: 24.9 pg — AB (ref 26.0–34.0)
MCHC: 29.8 g/dL — AB (ref 30.0–36.0)
MCV: 83.5 fL (ref 78.0–100.0)
MONOS PCT: 8 %
Monocytes Absolute: 0.7 10*3/uL (ref 0.1–1.0)
NEUTROS ABS: 5.2 10*3/uL (ref 1.7–7.7)
NEUTROS PCT: 55 %
Platelets: 383 10*3/uL (ref 150–400)
RBC: 4.42 MIL/uL (ref 3.87–5.11)
RDW: 17.2 % — ABNORMAL HIGH (ref 11.5–15.5)
WBC: 9.4 10*3/uL (ref 4.0–10.5)

## 2018-01-30 LAB — URINALYSIS, ROUTINE W REFLEX MICROSCOPIC
BACTERIA UA: NONE SEEN
Bilirubin Urine: NEGATIVE
Glucose, UA: >=500 mg/dL — AB
Hgb urine dipstick: NEGATIVE
Ketones, ur: NEGATIVE mg/dL
Leukocytes, UA: NEGATIVE
Nitrite: NEGATIVE
Protein, ur: NEGATIVE mg/dL
SPECIFIC GRAVITY, URINE: 1.03 (ref 1.005–1.030)
pH: 6 (ref 5.0–8.0)

## 2018-01-30 LAB — BASIC METABOLIC PANEL
Anion gap: 13 (ref 5–15)
BUN: 11 mg/dL (ref 6–20)
CALCIUM: 9.2 mg/dL (ref 8.9–10.3)
CO2: 23 mmol/L (ref 22–32)
CREATININE: 0.63 mg/dL (ref 0.44–1.00)
Chloride: 97 mmol/L — ABNORMAL LOW (ref 101–111)
GFR calc non Af Amer: >60 mL/min (ref >60)
Glucose, Bld: 380 mg/dL — ABNORMAL HIGH (ref 65–99)
Potassium: 3.9 mmol/L (ref 3.5–5.1)
SODIUM: 133 mmol/L — AB (ref 135–145)

## 2018-01-30 MED ORDER — METHOCARBAMOL 500 MG PO TABS: 500.0000 mg | ORAL_TABLET | Freq: Three times a day (TID) | ORAL | 0 refills | Status: DC

## 2018-01-30 MED ORDER — OXYCODONE-ACETAMINOPHEN 5-325 MG PO TABS: 1.0000 | ORAL_TABLET | ORAL | 0 refills | Status: DC | PRN

## 2018-01-30 MED ORDER — ONDANSETRON 8 MG PO TBDP
8.0000 mg | ORAL_TABLET | Freq: Once | ORAL | Status: AC
  Administered 2018-01-30: 8 mg via ORAL
  Filled 2018-01-30: qty 1

## 2018-01-30 MED ORDER — HYDROMORPHONE HCL 1 MG/ML IJ SOLN
1.0000 mg | Freq: Once | INTRAMUSCULAR | Status: AC
  Administered 2018-01-30: 1 mg via INTRAMUSCULAR
  Filled 2018-01-30: qty 1

## 2018-01-30 NOTE — ED Notes (Signed)
Patient c/o left lower back pain with pain radiating to left thigh x 2days.

## 2018-01-30 NOTE — ED Provider Notes (Signed)
[facility] EMERGENCY DEPARTMENT Provider Note   CSN: [id number] Arrival date & time: 01/30/18  1933     History   Chief Complaint Chief Complaint  Patient presents with  . Back Pain    HPI Linda Gonzalez is a 57 y.o. female.  HPI   Linda Gonzalez is a 57 y.o. female with a history of poorly controlled DM, chronic low back pain who is currently anticoagulated with Xarelto for previous DVT, presents to the Emergency Department complaining of increased low back pain for several weeks.  She states that she has been without her pain medication for nearly 1 year.  Her PCP she states is trying to arrange follow-up with pain management, but she has not heard back from anyone.  She describes a sharp stabbing pain to her mid lower back that radiates to her right buttock and thigh.  Pain is worse with certain movements and walking.  Improves somewhat with rest.  She has tried taking Flexeril and Tylenol without relief.  She denies recent injury, abdominal pain, urine or bowel changes, numbness or weakness of the lower extremities and fever.  Past Medical History:  Diagnosis Date  . Arthritis   . Asthma   . COPD (chronic obstructive pulmonary disease) ([place])   . Diabetes mellitus without complication ([place])   . DVT, lower extremity ([place])   . Hyperlipidemia   . Hypertension   . Neuromuscular disorder ([place])   . OSA (obstructive sleep apnea) 10/10/2017  . Situational depression 10/04/2017    Patient Active Problem List   Diagnosis Date Noted  . Vitamin D deficiency 10/10/2017  . OSA (obstructive sleep apnea) 10/10/2017  . Chronic gastritis 10/10/2017  . Tubular adenoma of colon 10/10/2017  . Tobacco abuse 10/04/2017  . Situational depression 10/04/2017  . HTN (hypertension) 10/04/2017  . HLD (hyperlipidemia) 10/04/2017  . Uncontrolled type 2 diabetes mellitus ([place]) 10/04/2017  . Class 2 obesity due to excess calories with body mass index (BMI) of 39.0 to 39.9 in adult 10/04/2017  .  Chronic radicular low back pain 10/04/2017  . 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 History of DVT of lower extremity 10/04/2017    Past Surgical History:  Procedure Laterality Date  . BACK SURGERY  2015  . EXPLORATORY LAPAROTOMY    . FRACTURE SURGERY    . SHOULDER SURGERY  2010   right  . SPINE SURGERY     LUMBAR SPINE    OB History    No data available       Home Medications    Prior to Admission medications   Medication Sig Start Date End Date Taking? Authorizing Provider  atorvastatin (LIPITOR) 40 MG tablet Take 1 tablet (40 mg total) daily by mouth. 10/09/17   Raylene Everts, MD  blood glucose meter kit and supplies KIT Dispense based on patient and insurance preference. Use up to four times daily as directed. (FOR ICD-9 250.00, 250.01). 11/02/17   Raylene Everts, MD  cyclobenzaprine (FLEXERIL) 5 MG tablet TAKE 1 TABLET BY MOUTH THREE TIMES DAILY AS NEEDED FOR MUSCLE SPASMS 01/09/18   Raylene Everts, MD  DULoxetine (CYMBALTA) 60 MG capsule Take 1 capsule (60 mg total) daily by mouth. 10/04/17   Raylene Everts, MD  losartan (COZAAR) 100 MG tablet Take 1 tablet (100 mg total) daily by mouth. 10/04/17   Raylene Everts, MD  metFORMIN (GLUCOPHAGE XR) 750 MG 24 hr tablet Take 1 tablet (750 mg total) 2 (  two) times daily by mouth. 10/09/17   Raylene Everts, MD  pregabalin (LYRICA) 100 MG capsule Take 1 capsule (100 mg total) 3 (three) times daily by mouth. 10/04/17   Raylene Everts, MD  rivaroxaban (XARELTO) 20 MG TABS tablet Take 1 tablet (20 mg total) daily with supper by mouth. 10/04/17   Raylene Everts, MD  tiZANidine (ZANAFLEX) 4 MG capsule Take 1 capsule (4 mg total) 3 (three) times daily by mouth. 10/04/17   Raylene Everts, MD  Vitamin D, Ergocalciferol, (DRISDOL) 50000 units CAPS capsule Take 1 capsule (50,000 Units total) every 7 (seven) days by mouth. 10/09/17   Raylene Everts, MD    Family History Family History    Problem Relation Age of Onset  . Alcohol abuse Mother   . Arthritis Mother   . Mental illness Mother   . Diabetes Father   . Heart disease Father 83  . Heart disease Brother 96  . Learning disabilities Son   . Tuberous sclerosis Son     Social History Social History   Tobacco Use  . Smoking status: Current Every Day Smoker    Packs/day: 1.00    Types: Cigarettes    Start date: 11/27/1978  . Smokeless tobacco: Never Used  Substance Use Topics  . Alcohol use: No    Frequency: Never  . Drug use: No     Allergies   Ace inhibitors; Repaglinide; and Vancomycin   Review of Systems Review of Systems  Constitutional: Negative for fever.  Respiratory: Negative for shortness of breath.   Gastrointestinal: Negative for abdominal pain, constipation and vomiting.  Genitourinary: Negative for decreased urine volume, difficulty urinating, dysuria, flank pain and hematuria.  Musculoskeletal: Positive for back pain. Negative for joint swelling.  Skin: Negative for rash.  Neurological: Negative for weakness and numbness.  All other systems reviewed and are negative.    Physical Exam Updated Vital Signs BP (!) 141/82 (BP Location: Right Arm)   Pulse (!) 119   Temp 99.3 F (37.4 C) (Oral)   Resp 20   Ht 5' 7"  (1.702 m)   Wt 104.3 kg (230 lb)   LMP 11/02/2012 (Approximate)   SpO2 98%   BMI 36.02 kg/m   Physical Exam  Constitutional: She is oriented to person, place, and time. She appears well-developed and well-nourished. No distress.  HENT:  Head: Normocephalic and atraumatic.  Neck: Normal range of motion. Neck supple.  Cardiovascular: Normal rate, regular rhythm and intact distal pulses.  DP pulses are strong and palpable bilaterally  Pulmonary/Chest: Effort normal and breath sounds normal. No respiratory distress. She exhibits no tenderness.  Abdominal: Soft. She exhibits no distension. There is no tenderness.  Musculoskeletal: She exhibits tenderness. She exhibits no  edema.       Lumbar back: She exhibits tenderness and pain. She exhibits normal range of motion, no swelling, no deformity, no laceration and normal pulse.  ttp of the lower lumbar spine and left paraspinal muscles. Positive SLR on left at 20 degrees.  Pt has 5/5 strength against resistance of bilateral lower extremities.     Neurological: She is alert and oriented to person, place, and time. She has normal strength. No sensory deficit. She exhibits normal muscle tone. Coordination and gait normal.  Reflex Scores:      Patellar reflexes are 2+ on the right side and 2+ on the left side.      Achilles reflexes are 2+ on the right side and 2+ on the left  side. Skin: Skin is warm and dry. Capillary refill takes less than 2 seconds. No rash noted.  Nursing note and vitals reviewed.    ED Treatments / Results  Labs (all labs ordered are listed, but only abnormal results are displayed) Labs Reviewed  BASIC METABOLIC PANEL - Abnormal; Notable for the following components:      Result Value   Sodium 133 (*)    Chloride 97 (*)    Glucose, Bld 380 (*)    All other components within normal limits  CBC WITH DIFFERENTIAL/PLATELET - Abnormal; Notable for the following components:   Hemoglobin 11.0 (*)    MCH 24.9 (*)    MCHC 29.8 (*)    RDW 17.2 (*)    All other components within normal limits  URINALYSIS, ROUTINE W REFLEX MICROSCOPIC - Abnormal; Notable for the following components:   Glucose, UA >=500 (*)    Squamous Epithelial / LPF 0-5 (*)    All other components within normal limits    EKG  EKG Interpretation None       Radiology Dg Lumbar Spine Complete  Result Date: 01/30/2018 CLINICAL DATA:  Low back pain for several days EXAM: LUMBAR SPINE - COMPLETE 4+ VIEW COMPARISON:  None. FINDINGS: Five lumbar type vertebral bodies are well visualized. Vertebral body height is well maintained. Pedicle screws are noted at L4, L5 and S1 with interbody fusion at L4-5. Mild osteophytic changes  are seen. No hardware failure is noted. Mild anterolisthesis of L4 with respect to L3 and L5 is noted. No acute bony abnormality is noted. Aortic calcifications are seen without aneurysmal dilatation. IMPRESSION: Degenerative and postoperative changes as described. No acute abnormality noted. Electronically Signed   By: Inez Catalina M.D.   On: 01/30/2018 20:34    Procedures Procedures (including critical care time)  Medications Ordered in ED Medications  HYDROmorphone (DILAUDID) injection 1 mg (1 mg Intramuscular Given 01/30/18 2035)  ondansetron (ZOFRAN-ODT) disintegrating tablet 8 mg (8 mg Oral Given 01/30/18 2035)     Initial Impression / Assessment and Plan / ED Course  I have reviewed the triage vital signs and the nursing notes.  Pertinent labs & imaging results that were available during my care of the patient were reviewed by me and considered in my medical decision making (see chart for details).     Patient with likely acute on chronic low back pain who is currently out of her pain medication.  No focal neuro deficits on exam.  She is well-appearing.  Pain is improved after medication.  She is ambulated to the restroom with a slow but steady gait.  Low clinical suspicion for cauda equina or spinal abscess.  Blood sugar is elevated and on review of previous labs, appears to be historically poorly controlled and likely due to poor diet and medication noncompliance, no clinical concern for DKA .  On further questioning, patient does admit to skip doses of her medication.  Advised patient on importance of proper management of her diabetes.  Patient reviewed on the narcotic database no narcotics on file since last year.  Patient has a local PCP and requesting referral information for pain management she appears stable for discharge agrees to treatment plan and return precautions were discussed.  Final Clinical Impressions(s) / ED Diagnoses   Final diagnoses:  Chronic left-sided low back  pain with left-sided sciatica  Hyperglycemia    ED Discharge Orders    None       Kem Parkinson, PA-C 01/31/18 1247  Margette Fast, MD 01/31/18 1334

## 2018-01-30 NOTE — ED Notes (Signed)
Patient transported to X-ray 

## 2018-01-30 NOTE — ED Triage Notes (Signed)
Pt c/o increase in chronic left lower back pain. Pt denies any new injuries. Pt is ambulatory in triage.

## 2018-01-30 NOTE — Discharge Instructions (Signed)
Be sure to take your metformin when you get home.  Your blood sugar is elevated tonight.  It is important that you check your blood sugar often and do not miss any doses of your medication.  Alternate ice and heat to your lower back.  Try to avoid bending or twisting movements.  Call your primary doctor to arrange a follow-up appointment regarding your blood sugar. return to the ER for any worsening symptoms.

## 2018-01-31 ENCOUNTER — Encounter: Payer: Self-pay | Admitting: Family Medicine

## 2018-01-31 DIAGNOSIS — I700 Atherosclerosis of aorta: Secondary | ICD-10-CM | POA: Insufficient documentation

## 2018-02-04 ENCOUNTER — Encounter: Payer: Self-pay | Admitting: Family Medicine

## 2018-02-05 ENCOUNTER — Encounter: Payer: Self-pay | Admitting: Family Medicine

## 2018-02-05 ENCOUNTER — Ambulatory Visit (INDEPENDENT_AMBULATORY_CARE_PROVIDER_SITE_OTHER): Payer: Medicare Other | Admitting: Family Medicine

## 2018-02-05 VITALS — BP 124/84 | HR 100 | Temp 96.7°F | Ht 67.0 in | Wt 244.8 lb

## 2018-02-05 DIAGNOSIS — Z8739 Personal history of other diseases of the musculoskeletal system and connective tissue: Secondary | ICD-10-CM

## 2018-02-05 DIAGNOSIS — M5416 Radiculopathy, lumbar region: Secondary | ICD-10-CM

## 2018-02-05 DIAGNOSIS — G8929 Other chronic pain: Secondary | ICD-10-CM | POA: Diagnosis not present

## 2018-02-05 DIAGNOSIS — E1165 Type 2 diabetes mellitus with hyperglycemia: Secondary | ICD-10-CM | POA: Diagnosis not present

## 2018-02-05 MED ORDER — GLIPIZIDE ER 5 MG PO TB24: 5.0000 mg | ORAL_TABLET | Freq: Every day | ORAL | 2 refills | Status: DC

## 2018-02-05 MED ORDER — OXYCODONE-ACETAMINOPHEN 10-325 MG PO TABS: ORAL_TABLET | ORAL | 0 refills | Status: DC

## 2018-02-05 MED ORDER — METHOCARBAMOL 500 MG PO TABS: 500.0000 mg | ORAL_TABLET | Freq: Three times a day (TID) | ORAL | 0 refills | Status: DC

## 2018-02-05 NOTE — Patient Instructions (Addendum)
Continue robaxin at night Add glipizide for diabetes management Take one a day in the morning  We will re send the pain referral to [facility]  Need blood work to check the "heat" in your low back I will send you a letter with your test results.  If there is anything of concern, we will call right away.  You need follow up in 3 months for the diabetes Start calling now for new PCP

## 2018-02-05 NOTE — Progress Notes (Signed)
Chief Complaint  Patient presents with  . Follow-up    ER, high blood sugar, glucose in urine   Patient is here for emergency room follow-up. Her emergency room record and x-rays are reviewed with her. 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  Her back is been very painful for her of late.  She has been trying to get into a back clinic.  I referred her for pain management back in December.  She still does not have an appointment. She states that her pain became excruciating on the day of her ER visit, she states she "could not stand it anymore". X-ray showed the expected postoperative changes, nothing acute She was treated with an injection for pain and 15 Percocet 5 mg Is been 6 days and she is taking all of these. I explained to her that Percocet, when prescribed, is to be used as needed for severe pain and not taken one after another.  She informs me that she has severe pain all of the time. They also gave her Robaxin which she is taking daily.  She thinks this has helped. She has no red flag symptoms such as fever, cancer diagnosis, urinary symptoms. She also tells me that her sugar was over 300.  This I know.  She is a noncompliant diabetic.  She eats Oreos and drinks coke.  I told her that I am going to add another diabetes medicine, and refer her to a diabetes educator.  I told her that she cannot expect to control her diabetes without effort. When she had her back surgery she was hospitalized for a month because she developed an infection in the bones.  She had had IV antibiotics.  She was told that this could come back "at any time".  She has noticed the skin overlying her incision is hot.  She thinks this is why her back is hurting.  We will do a CBC and CRP to make sure she does not have a return of her osteomyelitis.  Patient Active Problem List   Diagnosis Date Noted  . Aortic atherosclerosis ([place]) 01/31/2018  . Vitamin D deficiency 10/10/2017  . OSA (obstructive  sleep apnea) 10/10/2017  . Chronic gastritis 10/10/2017  . Tubular adenoma of colon 10/10/2017  . Tobacco abuse 10/04/2017  . Situational depression 10/04/2017  . HTN (hypertension) 10/04/2017  . HLD (hyperlipidemia) 10/04/2017  . Uncontrolled type 2 diabetes mellitus ([place]) 10/04/2017  . Class 2 obesity due to excess calories with body mass index (BMI) of 39.0 to 39.9 in adult 10/04/2017  . Chronic radicular low back pain 10/04/2017  . 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 History of DVT of lower extremity 10/04/2017    Outpatient Encounter Medications as of 02/05/2018  Medication Sig  . atorvastatin (LIPITOR) 40 MG tablet Take 1 tablet (40 mg total) daily by mouth.  . blood glucose meter kit and supplies KIT Dispense based on patient and insurance preference. Use up to four times daily as directed. (FOR ICD-9 250.00, 250.01).  . DULoxetine (CYMBALTA) 60 MG capsule Take 1 capsule (60 mg total) daily by mouth.  . losartan (COZAAR) 100 MG tablet Take 1 tablet (100 mg total) daily by mouth.  . metFORMIN (GLUCOPHAGE XR) 750 MG 24 hr tablet Take 1 tablet (750 mg total) 2 (two) times daily by mouth.  . methocarbamol (ROBAXIN) 500 MG tablet Take 1 tablet (500 mg total) by mouth 3 (three) times daily.  . pregabalin (LYRICA)  100 MG capsule Take 1 capsule (100 mg total) 3 (three) times daily by mouth.  . rivaroxaban (XARELTO) 20 MG TABS tablet Take 1 tablet (20 mg total) daily with supper by mouth.  . [DISCONTINUED] methocarbamol (ROBAXIN) 500 MG tablet Take 1 tablet (500 mg total) by mouth 3 (three) times daily.  Marland Kitchen glipiZIDE (GLUCOTROL XL) 5 MG 24 hr tablet Take 1 tablet (5 mg total) by mouth daily with breakfast.  . oxyCODONE-acetaminophen (PERCOCET) 10-325 MG tablet Take one every 8 hours as needed SEVERE PAIN  . Vitamin D, Ergocalciferol, (DRISDOL) 50000 units CAPS capsule Take 1 capsule (50,000 Units total) every 7 (seven) days by mouth. (Patient not taking: Reported on  02/05/2018)  . [DISCONTINUED] oxyCODONE-acetaminophen (PERCOCET/ROXICET) 5-325 MG tablet Take 1 tablet by mouth every 4 (four) hours as needed. (Patient not taking: Reported on 02/05/2018)   No facility-administered encounter medications on file as of 02/05/2018.     Allergies  Allergen Reactions  . Ace Inhibitors Cough  . Repaglinide Nausea And Vomiting  . Vancomycin Rash    Review of Systems  Constitutional: Positive for unexpected weight change. Negative for activity change and appetite change.       Weight gain  HENT: Negative for congestion, dental problem, postnasal drip and rhinorrhea.   Eyes: Negative for redness and visual disturbance.  Respiratory: Negative for cough and shortness of breath.   Cardiovascular: Negative for chest pain, palpitations and leg swelling.  Gastrointestinal: Negative for abdominal pain, constipation and diarrhea.  Endocrine: Positive for polydipsia, polyphagia and polyuria.  Genitourinary: Negative for difficulty urinating, frequency and vaginal bleeding.  Musculoskeletal: Positive for back pain and gait problem. Negative for arthralgias.  Neurological: Negative for dizziness and headaches.  Psychiatric/Behavioral: Positive for dysphoric mood. Negative for sleep disturbance and suicidal ideas. The patient is nervous/anxious.     BP 124/84 (BP Location: Right Arm, Patient Position: Sitting, Cuff Size: Normal)   Pulse 100   Temp (!) 96.7 F (35.9 C) (Temporal)   Ht _0  (1.702 m)   Wt 244 lb 12 oz (111 kg)   LMP 11/02/2012 (Approximate)   SpO2 96%   BMI 38.33 kg/m   Physical Exam  Constitutional: She is oriented to person, place, and time. She appears well-developed and well-nourished.  Obese.  Very demonstrative with frequent position changes, and vocalization due to back pain  HENT:  Head: Normocephalic and atraumatic.  Mouth/Throat: Oropharynx is clear and moist.  Eyes: Conjunctivae are normal. Pupils are equal, round, and reactive to  light.  Neck: Normal range of motion. Neck supple.  Cardiovascular: Normal rate, regular rhythm and normal heart sounds.  Pulmonary/Chest: Effort normal and breath sounds normal. No respiratory distress.  Musculoskeletal: Normal range of motion. She exhibits no edema.  Antalgic gait.  Stiff movements.    Neurological: She is alert and oriented to person, place, and time. She displays normal reflexes. Coordination normal.  Skin: Skin is warm and dry.  Skin overlying the low back is palpably warm and moist  Psychiatric: She has a normal mood and affect. Thought content normal.  Evidence of symptom magnification  Nursing note and vitals reviewed.   ASSESSMENT/PLAN:  1. Uncontrolled type 2 diabetes mellitus with hyperglycemia (HCC) Add glipizide.  Moderate diet.  Stop sweets. - Ambulatory referral to diabetic education  2. History of osteomyelitis Warmth of skin.  Not likely osteomyelitis, nothing shows on x-ray but will do blood work - CBC With Differential - Sed Rate (ESR) - C-reactive protein 3.  Chronic radicular  low back pain Referred for pain management. She is given a limited number of oxycodone to take as needed pain.  She is told that these must last until she sees the pain specialist.  They will not be refilled.  She knows I do not do chronic pain management.  I am getting her a few pain pills to take if her pain gets so severe that she feels like going to the emergency room. Patient Instructions  Continue robaxin at night Add glipizide for diabetes management Take one a day in the morning  We will re send the pain referral [facility]  Need blood work to check the "heat" in your low back I will send you a letter with your test results.  If there is anything of concern, we will call right away.  You need follow up in 3 months for the diabetes Start calling now for new PCP     Raylene Everts, MD

## 2018-02-06 ENCOUNTER — Other Ambulatory Visit: Payer: Self-pay

## 2018-02-06 MED ORDER — METHOCARBAMOL 500 MG PO TABS: 500.0000 mg | ORAL_TABLET | Freq: Three times a day (TID) | ORAL | 0 refills | Status: DC

## 2018-03-04 ENCOUNTER — Other Ambulatory Visit: Payer: Self-pay

## 2018-03-04 ENCOUNTER — Emergency Department (HOSPITAL_COMMUNITY): Payer: Medicare Other

## 2018-03-04 ENCOUNTER — Encounter (HOSPITAL_COMMUNITY): Payer: Self-pay | Admitting: Emergency Medicine

## 2018-03-04 ENCOUNTER — Emergency Department (HOSPITAL_COMMUNITY)
Admission: EM | Admit: 2018-03-04 | Discharge: 2018-03-04 | Disposition: A | Discharge status: Home / Self Care | Payer: Medicare Other | Attending: Emergency Medicine | Admitting: Emergency Medicine

## 2018-03-04 DIAGNOSIS — F17210 Nicotine dependence, cigarettes, uncomplicated: Secondary | ICD-10-CM | POA: Insufficient documentation

## 2018-03-04 DIAGNOSIS — G8929 Other chronic pain: Secondary | ICD-10-CM | POA: Diagnosis not present

## 2018-03-04 DIAGNOSIS — R0789 Other chest pain: Secondary | ICD-10-CM

## 2018-03-04 DIAGNOSIS — J45909 Unspecified asthma, uncomplicated: Secondary | ICD-10-CM | POA: Insufficient documentation

## 2018-03-04 DIAGNOSIS — E119 Type 2 diabetes mellitus without complications: Secondary | ICD-10-CM | POA: Insufficient documentation

## 2018-03-04 DIAGNOSIS — Z7901 Long term (current) use of anticoagulants: Secondary | ICD-10-CM | POA: Insufficient documentation

## 2018-03-04 DIAGNOSIS — J449 Chronic obstructive pulmonary disease, unspecified: Secondary | ICD-10-CM | POA: Insufficient documentation

## 2018-03-04 DIAGNOSIS — R079 Chest pain, unspecified: Secondary | ICD-10-CM | POA: Diagnosis present

## 2018-03-04 DIAGNOSIS — Z79899 Other long term (current) drug therapy: Secondary | ICD-10-CM | POA: Insufficient documentation

## 2018-03-04 DIAGNOSIS — M25571 Pain in right ankle and joints of right foot: Secondary | ICD-10-CM | POA: Insufficient documentation

## 2018-03-04 DIAGNOSIS — Z7984 Long term (current) use of oral hypoglycemic drugs: Secondary | ICD-10-CM | POA: Insufficient documentation

## 2018-03-04 DIAGNOSIS — I10 Essential (primary) hypertension: Secondary | ICD-10-CM | POA: Insufficient documentation

## 2018-03-04 HISTORY — DX: Radiculopathy, lumbar region: M54.16

## 2018-03-04 HISTORY — DX: Dorsalgia, unspecified: M54.9

## 2018-03-04 HISTORY — DX: Other chronic pain: G89.29

## 2018-03-04 LAB — CBC WITH DIFFERENTIAL/PLATELET
Basophils Absolute: 0 10*3/uL (ref 0.0–0.1)
Basophils Relative: 0 %
EOS ABS: 0.1 10*3/uL (ref 0.0–0.7)
Eosinophils Relative: 2 %
HCT: 38.1 % (ref 36.0–46.0)
HEMOGLOBIN: 11.5 g/dL — AB (ref 12.0–15.0)
LYMPHS ABS: 2.6 10*3/uL (ref 0.7–4.0)
Lymphocytes Relative: 32 %
MCH: 24.9 pg — AB (ref 26.0–34.0)
MCHC: 30.2 g/dL (ref 30.0–36.0)
MCV: 82.6 fL (ref 78.0–100.0)
MONO ABS: 0.4 10*3/uL (ref 0.1–1.0)
MONOS PCT: 5 %
NEUTROS PCT: 61 %
Neutro Abs: 5 10*3/uL (ref 1.7–7.7)
Platelets: 360 10*3/uL (ref 150–400)
RBC: 4.61 MIL/uL (ref 3.87–5.11)
RDW: 17.9 % — AB (ref 11.5–15.5)
WBC: 8.1 10*3/uL (ref 4.0–10.5)

## 2018-03-04 LAB — BASIC METABOLIC PANEL
ANION GAP: 10 (ref 5–15)
BUN: 10 mg/dL (ref 6–20)
CALCIUM: 9.1 mg/dL (ref 8.9–10.3)
CO2: 23 mmol/L (ref 22–32)
CREATININE: 0.54 mg/dL (ref 0.44–1.00)
Chloride: 105 mmol/L (ref 101–111)
GFR calc Af Amer: >60 mL/min (ref >60)
GFR calc non Af Amer: >60 mL/min (ref >60)
GLUCOSE: 217 mg/dL — AB (ref 65–99)
Potassium: 4 mmol/L (ref 3.5–5.1)
Sodium: 138 mmol/L (ref 135–145)

## 2018-03-04 LAB — TROPONIN I: Troponin I: <0.03 ng/mL (ref <0.03)

## 2018-03-04 MED ORDER — OXYCODONE-ACETAMINOPHEN 5-325 MG PO TABS
1.0000 | ORAL_TABLET | Freq: Once | ORAL | Status: AC
  Administered 2018-03-04: 1 via ORAL
  Filled 2018-03-04: qty 1

## 2018-03-04 MED ORDER — CYCLOBENZAPRINE HCL 10 MG PO TABS: 10.0000 mg | ORAL_TABLET | Freq: Three times a day (TID) | ORAL | 0 refills | Status: DC | PRN

## 2018-03-04 MED ORDER — METAXALONE 800 MG PO TABS: 800.0000 mg | ORAL_TABLET | Freq: Three times a day (TID) | ORAL | 0 refills | Status: DC | PRN

## 2018-03-04 NOTE — ED Triage Notes (Signed)
Pt recently had increase in lyrica and robaxin for chronic pain. Pt states since starting higher dose she has been more anxious and having intermittent episodes of sharp chest pain last a few seconds at a time.

## 2018-03-04 NOTE — ED Notes (Signed)
Pt c/o headache, rating 7/10 and requesting medication prior to discharge. EDP notified. Order given for Ibuprofen 400mg  PO x 1. Pt declined.

## 2018-03-04 NOTE — Discharge Instructions (Signed)
Take the new prescription as directed.  Apply moist heat or ice to the area(s) of discomfort, for 15 minutes at a time, several times per day for the next few days.  Do not fall asleep on a heating or ice pack.  Call your regular medical doctor today to schedule a follow up appointment this week. Call your Pain Management doctor today to schedule a follow up appointment within the next week.  Return to the Emergency Department immediately if worsening.

## 2018-03-04 NOTE — ED Provider Notes (Signed)
[facility] EMERGENCY DEPARTMENT Provider Note   CSN: [id number] Arrival date & time: 03/04/18  0935     History   Chief Complaint Chief Complaint  Patient presents with  . Chest Pain  . Ankle Pain    chronic    HPI Linda Gonzalez is a 57 y.o. female.  HPI  Pt was seen at 1010. Per pt, c/o gradual onset and persistence of constant left upper chest wall "pain" that began several days ago. Pt describes the CP as "sharp" then "dull." States the "dull" CP has been constant for several days with intermittent "sharp pains" that last for only a few seconds before resolving, then coming back again. Pt also c/o acute flair of her chronic right ankle/foot "pain" for the past several days. Denies any change in her usual chronic pain. Denies new injury. Denies focal motor weakness, no tingling/numbness in extremities, no fevers, no rash, no palpitations, no SOB/cough. Endorses compliance with xarelto.    Past Medical History:  Diagnosis Date  . Arthritis   . Asthma   . Chronic back pain   . COPD (chronic obstructive pulmonary disease) ([place])   . Diabetes mellitus without complication ([place])   . DVT, lower extremity ([place])   . Hyperlipidemia   . Hypertension   . Lumbar radiculopathy   . Neuromuscular disorder ([place])   . OSA (obstructive sleep apnea) 10/10/2017  . Pain management   . Situational depression 10/04/2017    Patient Active Problem List   Diagnosis Date Noted  . Aortic atherosclerosis ([place]) 01/31/2018  . Vitamin D deficiency 10/10/2017  . OSA (obstructive sleep apnea) 10/10/2017  . Chronic gastritis 10/10/2017  . Tubular adenoma of colon 10/10/2017  . Tobacco abuse 10/04/2017  . Situational depression 10/04/2017  . HTN (hypertension) 10/04/2017  . HLD (hyperlipidemia) 10/04/2017  . Uncontrolled type 2 diabetes mellitus ([place]) 10/04/2017  . Class 2 obesity due to excess calories with body mass index (BMI) of 39.0 to 39.9 in adult 10/04/2017  . Chronic radicular low back  pain 10/04/2017  . 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 History of DVT of lower extremity 10/04/2017    Past Surgical History:  Procedure Laterality Date  . BACK SURGERY  2015  . EXPLORATORY LAPAROTOMY    . FRACTURE SURGERY    . SHOULDER SURGERY  2010   right  . SPINE SURGERY     LUMBAR SPINE     OB History   None      Home Medications    Prior to Admission medications   Medication Sig Start Date End Date Taking? Authorizing Provider  atorvastatin (LIPITOR) 40 MG tablet Take 1 tablet (40 mg total) daily by mouth. 10/09/17   Raylene Everts, MD  blood glucose meter kit and supplies KIT Dispense based on patient and insurance preference. Use up to four times daily as directed. (FOR ICD-9 250.00, 250.01). 11/02/17   Raylene Everts, MD  DULoxetine (CYMBALTA) 60 MG capsule Take 1 capsule (60 mg total) daily by mouth. 10/04/17   Raylene Everts, MD  glipiZIDE (GLUCOTROL XL) 5 MG 24 hr tablet Take 1 tablet (5 mg total) by mouth daily with breakfast. 02/05/18   Raylene Everts, MD  losartan (COZAAR) 100 MG tablet Take 1 tablet (100 mg total) daily by mouth. 10/04/17   Raylene Everts, MD  metFORMIN (GLUCOPHAGE XR) 750 MG 24 hr tablet Take 1 tablet (750 mg total) 2 (two) times daily by mouth. 10/09/17  Raylene Everts, MD  methocarbamol (ROBAXIN) 500 MG tablet Take 1 tablet (500 mg total) by mouth 3 (three) times daily. 02/06/18   Raylene Everts, MD  oxyCODONE-acetaminophen [facility]) 10-325 MG tablet Take one every 8 hours as needed SEVERE PAIN 02/05/18   Raylene Everts, MD  pregabalin (LYRICA) 100 MG capsule Take 1 capsule (100 mg total) 3 (three) times daily by mouth. 10/04/17   Raylene Everts, MD  rivaroxaban (XARELTO) 20 MG TABS tablet Take 1 tablet (20 mg total) daily with supper by mouth. 10/04/17   Raylene Everts, MD  Vitamin D, Ergocalciferol, (DRISDOL) 50000 units CAPS capsule Take 1 capsule (50,000 Units total) every 7  (seven) days by mouth. Patient not taking: Reported on 02/05/2018 10/09/17   Raylene Everts, MD    Family History Family History  Problem Relation Age of Onset  . Alcohol abuse Mother   . Arthritis Mother   . Mental illness Mother   . Diabetes Father   . Heart disease Father 35  . Heart disease Brother 65  . Learning disabilities Son   . Tuberous sclerosis Son     Social History Social History   Tobacco Use  . Smoking status: Current Every Day Smoker    Packs/day: 1.00    Types: Cigarettes    Start date: 11/27/1978  . Smokeless tobacco: Never Used  Substance Use Topics  . Alcohol use: No    Frequency: Never  . Drug use: No     Allergies   Ace inhibitors; Repaglinide; and Vancomycin   Review of Systems Review of Systems ROS: Statement: All systems negative except as marked or noted in the HPI; Constitutional: Negative for fever and chills. ; ; Eyes: Negative for eye pain, redness and discharge. ; ; ENMT: Negative for ear pain, hoarseness, nasal congestion, sinus pressure and sore throat. ; ; Cardiovascular: Negative for palpitations, diaphoresis, dyspnea and peripheral edema. ; ; Respiratory: Negative for cough, wheezing and stridor. ; ; Gastrointestinal: Negative for nausea, vomiting, diarrhea, abdominal pain, blood in stool, hematemesis, jaundice and rectal bleeding. . ; ; Genitourinary: Negative for dysuria, flank pain and hematuria. ; ; Musculoskeletal: +chronic right ankle/foot pain, chest wall pain.  Negative for back pain and neck pain. Negative for new trauma.; ; Skin: Negative for pruritus, rash, abrasions, blisters, bruising and skin lesion.; ; Neuro: Negative for headache, lightheadedness and neck stiffness. Negative for weakness, altered level of consciousness, altered mental status, extremity weakness, paresthesias, involuntary movement, seizure and syncope.       Physical Exam Updated Vital Signs BP (!) 120/106 (BP Location: Left Arm)   Pulse 89   Temp  98.6 F (37 C) (Oral)   Resp 16   Ht 5' 7"  (1.702 m)   Wt 106.6 kg (235 lb)   LMP 11/02/2012 (Approximate)   SpO2 95%   BMI 36.81 kg/m   Physical Exam 1015: Physical examination:  Nursing notes reviewed; Vital signs and O2 SAT reviewed;  Constitutional: Well developed, Well nourished, Well hydrated, In no acute distress; Head:  Normocephalic, atraumatic; Eyes: EOMI, PERRL, No scleral icterus; ENMT: Mouth and pharynx normal, Mucous membranes moist; Neck: Supple, Full range of motion, No lymphadenopathy; Cardiovascular: Regular rate and rhythm, No gallop; Respiratory: Breath sounds clear & equal bilaterally, No wheezes.  Speaking full sentences with ease, Normal respiratory effort/excursion; Chest: +left upper chest wall tender to palp which reproduces pt's pain. Movement normal; Abdomen: Soft, Nontender, Nondistended, Normal bowel sounds; Genitourinary: No CVA tenderness; Extremities: Peripheral pulses  normal, Well healed surgical scars right ankle/foot. NMS intact right foot, strong pedal pp, LE muscle compartments soft.  No right proximal fibular head tenderness. No knee, ankle, or foot tenderness.  No deformity, no ecchymosis, no open wounds.  +plantarflexion of right foot w/calf squeeze.  No palpable gap right Achilles's tendon.  No edema, No calf edema or asymmetry.; Neuro: AA&Ox3, Major CN grossly intact.  Speech clear. No gross focal motor or sensory deficits in extremities.; Skin: Color normal, Warm, Dry.   ED Treatments / Results  Labs (all labs ordered are listed, but only abnormal results are displayed)   EKG EKG Interpretation  Date/Time:  Monday March 04 2018 09:54:10 EDT Ventricular Rate:  91 PR Interval:    QRS Duration: 79 QT Interval:  364 QTC Calculation: 448 R Axis:   108 Text Interpretation:  Sinus rhythm Right axis deviation Low voltage, precordial leads Probable anteroseptal infarct, old Baseline wander No old tracing to compare Confirmed by Francine Graven  [phone]) on 03/04/2018 10:39:57 AM   Radiology   Procedures Procedures (including critical care time)  Medications Ordered in ED Medications  oxyCODONE-acetaminophen (PERCOCET/ROXICET) 5-325 MG per tablet 1 tablet (has no administration in time range)     Initial Impression / Assessment and Plan / ED Course  I have reviewed the triage vital signs and the nursing notes.  Pertinent labs & imaging results that were available during my care of the patient were reviewed by me and considered in my medical decision making (see chart for details).  MDM Reviewed: previous chart, nursing note and vitals Reviewed previous: labs and ECG Interpretation: labs, ECG and x-ray   Results for orders placed or performed during the hospital encounter of 08/19/29  Basic metabolic panel  Result Value Ref Range   Sodium 138 135 - 145 mmol/L   Potassium 4.0 3.5 - 5.1 mmol/L   Chloride 105 101 - 111 mmol/L   CO2 23 22 - 32 mmol/L   Glucose, Bld 217 (H) 65 - 99 mg/dL   BUN 10 6 - 20 mg/dL   Creatinine, Ser 0.54 0.44 - 1.00 mg/dL   Calcium 9.1 8.9 - 10.3 mg/dL   GFR calc non Af Amer >60 >60 mL/min   GFR calc Af Amer >60 >60 mL/min   Anion gap 10 5 - 15  Troponin I  Result Value Ref Range   Troponin I <0.03 <0.03 ng/mL  CBC with Differential  Result Value Ref Range   WBC 8.1 4.0 - 10.5 K/uL   RBC 4.61 3.87 - 5.11 MIL/uL   Hemoglobin 11.5 (L) 12.0 - 15.0 g/dL   HCT 38.1 36.0 - 46.0 %   MCV 82.6 78.0 - 100.0 fL   MCH 24.9 (L) 26.0 - 34.0 pg   MCHC 30.2 30.0 - 36.0 g/dL   RDW 17.9 (H) 11.5 - 15.5 %   Platelets 360 150 - 400 K/uL   Neutrophils Relative % 61 %   Neutro Abs 5.0 1.7 - 7.7 K/uL   Lymphocytes Relative 32 %   Lymphs Abs 2.6 0.7 - 4.0 K/uL   Monocytes Relative 5 %   Monocytes Absolute 0.4 0.1 - 1.0 K/uL   Eosinophils Relative 2 %   Eosinophils Absolute 0.1 0.0 - 0.7 K/uL   Basophils Relative 0 %   Basophils Absolute 0.0 0.0 - 0.1 K/uL   Dg Chest 2 View Result Date:  03/04/2018 CLINICAL DATA:  Right ankle and foot swelling for 2 days, history asthma, COPD, hypertension, diabetes mellitus, smoker  EXAM: CHEST - 2 VIEW COMPARISON:  None FINDINGS: Upper normal heart size. Mediastinal contours and pulmonary vascularity normal. Calcified granuloma RIGHT upper lobe. Lungs otherwise clear. No infiltrate, pleural effusion or pneumothorax. RIGHT shoulder prosthesis. IMPRESSION: No acute abnormalities. Electronically Signed   By: Lavonia Dana M.D.   On: 03/04/2018 11:35   Dg Ankle Complete Right Result Date: 03/04/2018 CLINICAL DATA:  RIGHT foot and ankle swelling for 2 days, RIGHT ankle surgery several years ago EXAM: RIGHT ANKLE - COMPLETE 3+ VIEW COMPARISON:  None FINDINGS: Scattered soft tissue swelling. Mild medial narrowing and lateral widening of ankle joint compatible with degenerative changes. Old calcified ossicles and small spurs at the medial malleolus. Talonavicular degenerative changes. Plantar calcaneal spur. Small metallic foreign body at lateral ankle. No acute fracture, dislocation, or bone destruction. 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 No acute abnormalities. Electronically Signed   By: Lavonia Dana M.D.   On: 03/04/2018 11:36   Dg Foot Complete Right Result Date: 03/04/2018 CLINICAL DATA:  RIGHT foot and ankle swelling for 2 days EXAM: RIGHT FOOT COMPLETE - 3+ VIEW COMPARISON:  None FINDINGS: Osseous mineralization normal. Significant talonavicular and ankle joint degenerative changes. Old calcified ossicles at tip of medial malleolus. No acute fracture, dislocation or bone destruction. Soft tissue swelling especially dorsally. Moderate-sized plantar calcaneal spur. IMPRESSION: Scattered degenerative changes as above. No acute abnormalities. Electronically Signed   By: Lavonia Dana M.D.   On: 03/04/2018 11:37    1020:  [place] PMP Database accessed:  No narcotic rx filled 2019, lyrica and oxycodone last filled 01/30/2017 and 01/31/2017.  Epic chart reviewed:  percocet was rx from the ED on 01/30/2018 and from her PMD 02/06/2028; neither are in [place] (even when search criteria expanded to include [place]).   1300:  Doubt PE as cause for symptoms with xarelto compliance.  Doubt ACS as cause for symptoms with normal troponin and unchanged EKG from previous after several days of constant atypical symptoms. Workup reassuring. Tx symptomatically at this time. Will not rx narcotics; pt aware. Dx and testing d/w pt and family.  Questions answered.  Verb understanding, agreeable to d/c home with outpt f/u.      Final Clinical Impressions(s) / ED Diagnoses   Final diagnoses:  None    ED Discharge Orders    None       Francine Graven, DO 03/08/18 [id number]

## 2018-03-07 ENCOUNTER — Encounter: Payer: Self-pay | Admitting: Family Medicine

## 2018-03-07 ENCOUNTER — Ambulatory Visit (INDEPENDENT_AMBULATORY_CARE_PROVIDER_SITE_OTHER): Payer: Medicare Other | Admitting: Family Medicine

## 2018-03-07 ENCOUNTER — Encounter: Payer: Self-pay | Admitting: Licensed Clinical Social Worker

## 2018-03-07 ENCOUNTER — Ambulatory Visit: Payer: Medicare Other | Admitting: Family Medicine

## 2018-03-07 VITALS — BP 140/80 | HR 89 | Temp 98.6°F | Ht 67.0 in | Wt 250.4 lb

## 2018-03-07 DIAGNOSIS — G894 Chronic pain syndrome: Secondary | ICD-10-CM | POA: Diagnosis not present

## 2018-03-07 DIAGNOSIS — Z86718 Personal history of other venous thrombosis and embolism: Secondary | ICD-10-CM

## 2018-03-07 DIAGNOSIS — I10 Essential (primary) hypertension: Secondary | ICD-10-CM | POA: Diagnosis not present

## 2018-03-07 DIAGNOSIS — F4321 Adjustment disorder with depressed mood: Secondary | ICD-10-CM

## 2018-03-07 DIAGNOSIS — E1165 Type 2 diabetes mellitus with hyperglycemia: Secondary | ICD-10-CM

## 2018-03-07 DIAGNOSIS — E559 Vitamin D deficiency, unspecified: Secondary | ICD-10-CM

## 2018-03-07 DIAGNOSIS — E782 Mixed hyperlipidemia: Secondary | ICD-10-CM | POA: Diagnosis present

## 2018-03-07 MED ORDER — VITAMIN D (ERGOCALCIFEROL) 1.25 MG (50000 UNIT) PO CAPS: 50000.0000 [IU] | ORAL_CAPSULE | ORAL | 0 refills | Status: DC

## 2018-03-07 MED ORDER — ATORVASTATIN CALCIUM 40 MG PO TABS: 40.0000 mg | ORAL_TABLET | Freq: Every day | ORAL | 3 refills | Status: AC

## 2018-03-07 MED ORDER — METFORMIN HCL ER 750 MG PO TB24: 750.0000 mg | ORAL_TABLET | Freq: Two times a day (BID) | ORAL | 6 refills | Status: AC

## 2018-03-07 MED ORDER — GLIPIZIDE ER 5 MG PO TB24: 5.0000 mg | ORAL_TABLET | Freq: Every day | ORAL | 2 refills | Status: DC

## 2018-03-07 MED ORDER — DULOXETINE HCL 60 MG PO CPEP: 60.0000 mg | ORAL_CAPSULE | Freq: Every day | ORAL | 2 refills | Status: AC

## 2018-03-07 MED ORDER — BLOOD GLUCOSE MONITOR KIT: PACK | 0 refills | Status: DC

## 2018-03-07 MED ORDER — LOSARTAN POTASSIUM 100 MG PO TABS: 100.0000 mg | ORAL_TABLET | Freq: Every day | ORAL | 2 refills | Status: AC

## 2018-03-07 MED ORDER — RIVAROXABAN 20 MG PO TABS: 20.0000 mg | ORAL_TABLET | Freq: Every day | ORAL | 2 refills | Status: AC

## 2018-03-07 NOTE — Progress Notes (Signed)
Type of Service: [place] is a 57 y.o. female referred by Dr. Criss Rosales for assist with managing chronic pain and symptoms of depression Patient reports :concerns with not being able to do the things she use to do; feeling sad and dealing with daily pain.  Denies thoughts of SI.  Patient recently started going to the pain clinic to help with pain management.  Life & Social patient lives with husband of 3 years and son ,all receive disability; limited social support outside of home; enjoys watching TV and listening to music; does not get out of the house much.  Recent Life changes: none reported Issues discussed: Integrated Behavioral Health services; support system, community resources;  Demonstration of relaxed breathing; opportunities to get out of the house more; previous and current coping skills; things patient enjoy or use to enjoy doing and education on Pacing.    Intervention: Reflective listening, supportive counseling, solutions focus strategies Psychoeducation ; Mindfulness or Relaxation Training Assessment/Plan:.Patient is having difficulty adjusting to her new normal with chronic pain. She may benefit from and is in agreement to implement pacing with her daily activities and well as diaphram breathing. Patient will F/U with [facility] as needed.   Casimer Lanius, LCSW Licensed Clinical Social Worker [facility]   [phone] 12:40 PM

## 2018-03-07 NOTE — Patient Instructions (Signed)
It was a pleasure to see you today! Thank you for choosing [facility] for your primary care. Linda Gonzalez was seen for establishing care. Come back to the clinic when you can to spend some more time talking about your diabetes, and go to the emergency room if you have any life threatening symptoms.  Today we refilled some meds, got familiar with your medical history and connected your with our behavioral health team.    If we did any lab work today, and the results require attention, either me or my nurse will get in touch with you. If everything is normal, you will get a letter in mail and a message via . If you don't hear from [place] in two weeks, please give [place] a call. Otherwise, we look forward to seeing you again at your next visit. If you have any questions or concerns before then, please call the clinic at [phone].  Please bring all your medications to every doctors visit  Sign up for My Chart to have easy access to your labs results, and communication with your Primary care physician.    Please check-out at the front desk before leaving the clinic.    Best,  Dr. Sherene Sires FAMILY MEDICINE RESIDENT - PGY1 03/07/2018 11:16 AM

## 2018-03-09 DIAGNOSIS — G894 Chronic pain syndrome: Secondary | ICD-10-CM | POA: Insufficient documentation

## 2018-03-09 NOTE — Assessment & Plan Note (Signed)
Chronic, refilled statin

## 2018-03-09 NOTE — Assessment & Plan Note (Signed)
Warm hand off to [facility] to help arrange long term mental health provision  No SI/HI

## 2018-03-09 NOTE — Assessment & Plan Note (Signed)
We discussed concerns for uncontrolled DM, she will reschedule another appt to discuss more indepth plans

## 2018-03-09 NOTE — Assessment & Plan Note (Signed)
Unknown etiology, prior LLE and RUE per patient, was told she needs anticoagulation for life  Refill chronic rivaroxaban.  We did have specific talk about there being risks to taking and not taking the medication and she chose to continue

## 2018-03-09 NOTE — Progress Notes (Signed)
Subjective:  Linda Gonzalez is a 57 y.o. female who presents to the [facility] today with a chief complaint of establishing care  Very pleasant patient.  She has a number of chronic health conditions that we discussed today to establish care.  She is coming here from Dr. Francesca Oman office..  Diabetes in uncontrolled, she is aware of this and will reschedule an appt to discuss further.  We did go over risks of uncontrolled disease and she desires to work on this  HTN is only borderline today, she says she takes her blood pressure med regularly.  We will track and consider additional meds  Hyperlidemia is chronic and she takes losartan, diet control  Has been inconsistent  She describes a hx of DVT in both her left leg and right arm.  She says she was never given a reason for either of those and was told she should be on anticoagulation for life.  She is currently on rivaroxaban and is consistent with this per her.  We discussed there are risks with both taking and stopping this medication.  She has not had an PE's or strokes, she has no current DVT symptoms to complain of  We discussed her depression as well.  She has been limited in her life goals and enjoyment of activities from chronic pain and it is beginning to weigh on her.  She denies SI/HI but would like someone to establishing mental health care with.  She takes duloxetine 60  Review of Systems  Constitutional: Negative for fever and weight loss.  HENT: Negative for congestion, ear pain and nosebleeds.   Eyes: Negative for blurred vision.  Respiratory: Negative for cough.   Cardiovascular: Negative for chest pain.  Gastrointestinal: Negative for blood in stool and constipation.  Genitourinary: Negative for dysuria.  Musculoskeletal: Positive for joint pain.  Skin: Negative for rash.  Psychiatric/Behavioral: Positive for depression. Negative for suicidal ideas. The patient does not have insomnia.      Objective:  Physical Exam: BP  140/80 (BP Location: Left Arm, Patient Position: Sitting, Cuff Size: Normal)   Pulse 89   Temp 98.6 F (37 C) (Oral)   Ht 5\' 7"  (1.702 m)   Wt 250 lb 6.4 oz (113.6 kg)   LMP 11/02/2012 (Approximate)   SpO2 98%   BMI 39.22 kg/m   Gen: NAD, uncomfortable with movements, overweight CV: RRR with no murmurs appreciated Pulm: NWOB, CTAB with no crackles, wheezes, or rhonchi GI: Soft, Nontender, Nondistended. MSK: no edema, cyanosis, or clubbing noted.  Decreased/tender ROM with R arm.  No DVT symptoms Skin: warm, dry Neuro: grossly normal, moves all extremities Psych: anxious affect and normal thought content  No results found for this or any previous visit (from the past 72 hour(s)).   Assessment/Plan:  HTN (hypertension) Borderline hypertensive, has been inconsistent with meds  Refilled losartan  Uncontrolled type 2 diabetes mellitus ([place]) We discussed concerns for uncontrolled DM, she will reschedule another appt to discuss more indepth plans  Situational depression Warm hand off to [facility] to help arrange long term mental health provision  No SI/HI  HLD (hyperlipidemia) Chronic, refilled statin  History of DVT of lower extremity Unknown etiology, prior LLE and RUE per patient, was told she needs anticoagulation for life  Refill chronic rivaroxaban.  We did have specific talk about there being risks to taking and not taking the medication and she chose to continue  Vitamin D deficiency Refilled VitD  Chronic pain syndrome Patient says she  was on chronic opioids for years and does not think they are working.  We discussed that I would not be starting opioids here and we could refer to pain/physical therapy.  She requested pain referal   Sherene Sires, DO FAMILY MEDICINE RESIDENT - PGY1 03/09/2018 7:08 AM

## 2018-03-09 NOTE — Assessment & Plan Note (Signed)
Refilled Vit D

## 2018-03-09 NOTE — Assessment & Plan Note (Signed)
Patient says she was on chronic opioids for years and does not think they are working.  We discussed that I would not be starting opioids here and we could refer to pain/physical therapy.  She requested pain referal

## 2018-03-09 NOTE — Assessment & Plan Note (Signed)
Borderline hypertensive, has been inconsistent with meds  Refilled losartan

## 2018-03-12 ENCOUNTER — Ambulatory Visit: Payer: Medicare Other | Admitting: Nutrition

## 2018-03-19 ENCOUNTER — Telehealth: Payer: Self-pay | Admitting: Family Medicine

## 2018-03-19 NOTE — Telephone Encounter (Signed)
Letter printed and taken to front desk for pickup

## 2018-03-19 NOTE — Telephone Encounter (Signed)
Pt needs a note from Dr Criss Rosales stating she has had permanent screws in pens placed. She is going on a cruise and needs medical proof that these may set off the [employer]. She would like to have it mailed to her or either left up front and her husband can pick it up at his next appointment.

## 2018-04-01 ENCOUNTER — Other Ambulatory Visit: Payer: Self-pay

## 2018-04-01 DIAGNOSIS — E1165 Type 2 diabetes mellitus with hyperglycemia: Secondary | ICD-10-CM

## 2018-04-01 MED ORDER — BLOOD GLUCOSE MONITOR KIT: PACK | 0 refills | Status: DC

## 2018-04-01 NOTE — Telephone Encounter (Signed)
Received call from [employer]. Hard copy prescription was given for diabetic supplies, however, PCP is not recognized as a Medicare PECOS prescriber in order to bill Part B Medicare. Will route to preceptor to e-prescribe.  Danley Danker, RN [facility] RN)

## 2018-04-01 NOTE — Telephone Encounter (Signed)
Unfortunately the computer forces will not let me ERx this today---who knows why. Can you call itin under my name? THANKS! Dorcas Mcmurray

## 2018-04-02 ENCOUNTER — Other Ambulatory Visit: Payer: Self-pay | Admitting: *Deleted

## 2018-04-02 DIAGNOSIS — E1165 Type 2 diabetes mellitus with hyperglycemia: Secondary | ICD-10-CM

## 2018-04-02 MED ORDER — BLOOD GLUCOSE MONITOR KIT: PACK | 0 refills | Status: AC

## 2018-04-02 NOTE — Telephone Encounter (Signed)
Per pharmacy, must be faxed or eprescribed. Printed Rx and faxed. Danley Danker, RN [facility] RN)

## 2018-05-03 ENCOUNTER — Encounter (HOSPITAL_COMMUNITY): Payer: Self-pay

## 2018-05-03 ENCOUNTER — Other Ambulatory Visit: Payer: Self-pay

## 2018-05-03 ENCOUNTER — Emergency Department (HOSPITAL_COMMUNITY)
Admission: EM | Admit: 2018-05-03 | Discharge: 2018-05-03 | Disposition: A | Discharge status: Home / Self Care | Payer: Medicare Other | Source: Home / Self Care | Attending: Emergency Medicine | Admitting: Emergency Medicine

## 2018-05-03 ENCOUNTER — Emergency Department (HOSPITAL_COMMUNITY): Payer: Medicare Other

## 2018-05-03 DIAGNOSIS — Z7901 Long term (current) use of anticoagulants: Secondary | ICD-10-CM

## 2018-05-03 DIAGNOSIS — F17210 Nicotine dependence, cigarettes, uncomplicated: Secondary | ICD-10-CM | POA: Insufficient documentation

## 2018-05-03 DIAGNOSIS — K122 Cellulitis and abscess of mouth: Secondary | ICD-10-CM | POA: Diagnosis not present

## 2018-05-03 DIAGNOSIS — Z7984 Long term (current) use of oral hypoglycemic drugs: Secondary | ICD-10-CM

## 2018-05-03 DIAGNOSIS — E119 Type 2 diabetes mellitus without complications: Secondary | ICD-10-CM

## 2018-05-03 DIAGNOSIS — K047 Periapical abscess without sinus: Secondary | ICD-10-CM

## 2018-05-03 DIAGNOSIS — K029 Dental caries, unspecified: Secondary | ICD-10-CM

## 2018-05-03 DIAGNOSIS — Z79899 Other long term (current) drug therapy: Secondary | ICD-10-CM | POA: Insufficient documentation

## 2018-05-03 DIAGNOSIS — J45909 Unspecified asthma, uncomplicated: Secondary | ICD-10-CM | POA: Insufficient documentation

## 2018-05-03 DIAGNOSIS — I10 Essential (primary) hypertension: Secondary | ICD-10-CM

## 2018-05-03 LAB — CBC WITH DIFFERENTIAL/PLATELET
Basophils Absolute: 0 10*3/uL (ref 0.0–0.1)
Basophils Relative: 0 %
Eosinophils Absolute: 0.1 10*3/uL (ref 0.0–0.7)
Eosinophils Relative: 1 %
HCT: 36.1 % (ref 36.0–46.0)
Hemoglobin: 11 g/dL — ABNORMAL LOW (ref 12.0–15.0)
Lymphocytes Relative: 25 %
Lymphs Abs: 2.9 10*3/uL (ref 0.7–4.0)
MCH: 25.5 pg — ABNORMAL LOW (ref 26.0–34.0)
MCHC: 30.5 g/dL (ref 30.0–36.0)
MCV: 83.6 fL (ref 78.0–100.0)
Monocytes Absolute: 0.5 10*3/uL (ref 0.1–1.0)
Monocytes Relative: 5 %
Neutro Abs: 8 10*3/uL — ABNORMAL HIGH (ref 1.7–7.7)
Neutrophils Relative %: 69 %
Platelets: 341 10*3/uL (ref 150–400)
RBC: 4.32 MIL/uL (ref 3.87–5.11)
RDW: 17.8 % — ABNORMAL HIGH (ref 11.5–15.5)
WBC: 11.5 10*3/uL — ABNORMAL HIGH (ref 4.0–10.5)

## 2018-05-03 LAB — BASIC METABOLIC PANEL
Anion gap: 9 (ref 5–15)
BUN: 11 mg/dL (ref 6–20)
CHLORIDE: 104 mmol/L (ref 101–111)
CO2: 26 mmol/L (ref 22–32)
Calcium: 9.7 mg/dL (ref 8.9–10.3)
Creatinine, Ser: 0.71 mg/dL (ref 0.44–1.00)
GFR calc Af Amer: >60 mL/min (ref >60)
GFR calc non Af Amer: >60 mL/min (ref >60)
GLUCOSE: 211 mg/dL — AB (ref 65–99)
POTASSIUM: 4.1 mmol/L (ref 3.5–5.1)
SODIUM: 139 mmol/L (ref 135–145)

## 2018-05-03 MED ORDER — CLINDAMYCIN PHOSPHATE 900 MG/50ML IV SOLN
900.0000 mg | Freq: Once | INTRAVENOUS | Status: AC
  Administered 2018-05-03: 900 mg via INTRAVENOUS
  Filled 2018-05-03: qty 50

## 2018-05-03 MED ORDER — NAPROXEN 250 MG PO TABS: ORAL_TABLET | ORAL | 0 refills | Status: DC

## 2018-05-03 MED ORDER — IOHEXOL 300 MG/ML  SOLN
75.0000 mL | Freq: Once | INTRAMUSCULAR | Status: AC | PRN
  Administered 2018-05-03: 75 mL via INTRAVENOUS

## 2018-05-03 MED ORDER — KETOROLAC TROMETHAMINE 30 MG/ML IJ SOLN
15.0000 mg | Freq: Once | INTRAMUSCULAR | Status: AC
  Administered 2018-05-03: 15 mg via INTRAVENOUS
  Filled 2018-05-03: qty 1

## 2018-05-03 MED ORDER — CLINDAMYCIN HCL 150 MG PO CAPS: ORAL_CAPSULE | ORAL | 0 refills | Status: DC

## 2018-05-03 NOTE — ED Triage Notes (Signed)
Pt states she called ems tonight mainly for her dental problems and pain, states her upper teeth are broken and cracked.  She has not seen a dentist recently.  Pt states she also was having trouble getting a good breath and having some chills.

## 2018-05-03 NOTE — ED Provider Notes (Signed)
[facility] EMERGENCY DEPARTMENT Provider Note   CSN: [id number] Arrival date & time: 05/03/18  0419  Time seen 04:30 AM   History   Chief Complaint Chief Complaint  Patient presents with  . Dental Pain    multiple complaints    HPI Linda Gonzalez is a 57 y.o. female.  HPI patient states yesterday afternoon her right upper tooth started getting painful.  She states her upper teeth have been falling out when she eats foods such as pizza.  She states she has a headache that is throbbing and pounding.  She feels like the right side of her face is swollen.  She has had nausea and feels cold but has not checked her temperature.  She states she has not seen a dentist since she was 57 years old because she is afraid of them.  Patient states she ran out of her pain pills on June 3.   PCP Sherene Sires, DO   Past Medical History:  Diagnosis Date  . Arthritis   . Asthma   . Chronic back pain   . COPD (chronic obstructive pulmonary disease) ([place])   . Diabetes mellitus without complication ([place])   . DVT, lower extremity ([place])   . Hyperlipidemia   . Hypertension   . Lumbar radiculopathy   . Neuromuscular disorder ([place])   . OSA (obstructive sleep apnea) 10/10/2017  . Situational depression 10/04/2017    Patient Active Problem List   Diagnosis Date Noted  . Chronic pain syndrome 03/09/2018  . Aortic atherosclerosis ([place]) 01/31/2018  . Vitamin D deficiency 10/10/2017  . OSA (obstructive sleep apnea) 10/10/2017  . Chronic gastritis 10/10/2017  . Tubular adenoma of colon 10/10/2017  . Tobacco abuse 10/04/2017  . Situational depression 10/04/2017  . HTN (hypertension) 10/04/2017  . HLD (hyperlipidemia) 10/04/2017  . Uncontrolled type 2 diabetes mellitus ([place]) 10/04/2017  . Class 2 obesity due to excess calories with body mass index (BMI) of 39.0 to 39.9 in adult 10/04/2017  . Chronic radicular low back pain 10/04/2017  . 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 History of DVT of lower extremity 10/04/2017    Past Surgical History:  Procedure Laterality Date  . BACK SURGERY  2015  . EXPLORATORY LAPAROTOMY    . FRACTURE SURGERY    . SHOULDER SURGERY  2010   right  . SPINE SURGERY     LUMBAR SPINE     OB History   None      Home Medications    Prior to Admission medications   Medication Sig Start Date End Date Taking? Authorizing Provider  atorvastatin (LIPITOR) 40 MG tablet Take 1 tablet (40 mg total) by mouth daily. 03/07/18   Sherene Sires, DO  blood glucose meter kit and supplies KIT Use up to four times daily as directed. 04/02/18   Dickie La, MD  clindamycin (CLEOCIN) 150 MG capsule Take 2 po QID x 10 days 05/03/18   Rolland Porter, MD  DULoxetine (CYMBALTA) 60 MG capsule Take 1 capsule (60 mg total) by mouth daily. 03/07/18   Sherene Sires, DO  glipiZIDE (GLUCOTROL XL) 5 MG 24 hr tablet Take 1 tablet (5 mg total) by mouth daily with breakfast. 03/07/18   Sherene Sires, DO  losartan (COZAAR) 100 MG tablet Take 1 tablet (100 mg total) by mouth daily. 03/07/18   Sherene Sires, DO  metFORMIN (GLUCOPHAGE XR) 750 MG 24 hr tablet Take 1 tablet (750 mg total) by mouth 2 (two)  times daily. 03/07/18   Sherene Sires, DO  naproxen (NAPROSYN) 250 MG tablet Take 1 po BID with food prn pain 05/03/18   Rolland Porter, MD  pregabalin (LYRICA) 100 MG capsule Take 1 capsule (100 mg total) 3 (three) times daily by mouth. 10/04/17   Raylene Everts, MD  rivaroxaban (XARELTO) 20 MG TABS tablet Take 1 tablet (20 mg total) by mouth daily with supper. 03/07/18   Sherene Sires, DO  Vitamin D, Ergocalciferol, (DRISDOL) 50000 units CAPS capsule Take 1 capsule (50,000 Units total) by mouth every 7 (seven) days. 03/07/18   Sherene Sires, DO    Family History Family History  Problem Relation Age of Onset  . Alcohol abuse Mother   . Arthritis Mother   . Mental illness Mother   . Diabetes Father   . Heart disease Father 62  . Heart disease Brother 46  . Learning disabilities  Son   . Tuberous sclerosis Son     Social History Social History   Tobacco Use  . Smoking status: Current Every Day Smoker    Packs/day: 1.00    Types: Cigarettes    Start date: 11/27/1978  . Smokeless tobacco: Never Used  Substance Use Topics  . Alcohol use: No    Frequency: Never  . Drug use: No  lives at home Lives with spouse Smokes 1/2 ppd On disability for chronic back pain.   Allergies   Ace inhibitors; Phenergan [promethazine hcl]; Repaglinide; and Vancomycin   Review of Systems Review of Systems  All other systems reviewed and are negative.    Physical Exam Updated Vital Signs BP (!) 151/71 (BP Location: Left Arm)   Pulse 88   Temp 98.7 F (37.1 C) (Oral)   Resp 20   Ht 5' 6"  (1.676 m)   Wt 113.4 kg (250 lb)   LMP 11/02/2012 (Approximate)   SpO2 98%   BMI 40.35 kg/m   Vital signs normal    Physical Exam  Constitutional: She is oriented to person, place, and time. She appears well-developed and well-nourished.  HENT:  Head: Normocephalic and atraumatic.  All of patient's upper teeth are broken off close to the gumline, she does appear to have some mild swelling of the right side of her face.  Her lower teeth are mainly intact without obvious cavities.  Eyes: Pupils are equal, round, and reactive to light. Conjunctivae and EOM are normal.  Neck: Normal range of motion. Neck supple.  Cardiovascular: Normal rate and regular rhythm.  Pulmonary/Chest: Effort normal. No respiratory distress.  Abdominal: Normal appearance. There is no CVA tenderness.  Genitourinary: Uterus is tender. Uterus is not enlarged.  Genitourinary Comments: Has blood in the vault. Cx is closed.   Musculoskeletal: Normal range of motion.  Good ROM without deformities.  Although she states she is paralyzed in her right upper extremity she is able to move it with some loss of range of motion.  Neurological: She is alert and oriented to person, place, and time. No cranial nerve  deficit.  Skin: Skin is warm, dry and intact. She is not diaphoretic.  Psychiatric: She has a normal mood and affect. Her behavior is normal. Thought content normal.  Nursing note and vitals reviewed.    ED Treatments / Results  Labs (all labs ordered are listed, but only abnormal results are displayed)  Results for orders placed or performed during the hospital encounter of 05/03/18  CBC with Differential  Result Value Ref Range   WBC 11.5 (H) 4.0 -  10.5 K/uL   RBC 4.32 3.87 - 5.11 MIL/uL   Hemoglobin 11.0 (L) 12.0 - 15.0 g/dL   HCT 36.1 36.0 - 46.0 %   MCV 83.6 78.0 - 100.0 fL   MCH 25.5 (L) 26.0 - 34.0 pg   MCHC 30.5 30.0 - 36.0 g/dL   RDW 17.8 (H) 11.5 - 15.5 %   Platelets 341 150 - 400 K/uL   Neutrophils Relative % 69 %   Neutro Abs 8.0 (H) 1.7 - 7.7 K/uL   Lymphocytes Relative 25 %   Lymphs Abs 2.9 0.7 - 4.0 K/uL   Monocytes Relative 5 %   Monocytes Absolute 0.5 0.1 - 1.0 K/uL   Eosinophils Relative 1 %   Eosinophils Absolute 0.1 0.0 - 0.7 K/uL   Basophils Relative 0 %   Basophils Absolute 0.0 0.0 - 0.1 K/uL  Basic metabolic panel  Result Value Ref Range   Sodium 139 135 - 145 mmol/L   Potassium 4.1 3.5 - 5.1 mmol/L   Chloride 104 101 - 111 mmol/L   CO2 26 22 - 32 mmol/L   Glucose, Bld 211 (H) 65 - 99 mg/dL   BUN 11 6 - 20 mg/dL   Creatinine, Ser 0.71 0.44 - 1.00 mg/dL   Calcium 9.7 8.9 - 10.3 mg/dL   GFR calc non Af Amer >60 >60 mL/min   GFR calc Af Amer >60 >60 mL/min   Anion gap 9 5 - 15     EKG None  Radiology Ct Maxillofacial W Contrast  Result Date: 05/03/2018 CLINICAL DATA:  Dental pain.  Right facial swelling. EXAM: CT MAXILLOFACIAL WITH CONTRAST TECHNIQUE: Multidetector CT imaging of the maxillofacial structures was performed with intravenous contrast. Multiplanar CT image reconstructions were also generated. CONTRAST:  43m OMNIPAQUE IOHEXOL 300 MG/ML  SOLN COMPARISON:  None. FINDINGS: Osseous: No facial fracture. Dental: There is severe dental  disease with multiple large caries and numerous missing teeth. All of the maxillary teeth have large caries. There is a periapical lucency at the root of tooth 5 with dehiscence of the anterior maxillary cortex. There is moderate overlying soft tissue swelling that extends into the subcutaneous facial fat. Small, crescentic hypoattenuation overlying the dehiscence may indicate a small subperiosteal abscess. There is also a large periapical lucency at the root of tooth 13. There is dental amalgam within multiple mandibular teeth, which are generally in better condition than the upper teeth. There is partial destruction of the most posterior remaining right mandibular tooth with an associated periapical lucency. Orbits: The globes are intact. Normal appearance of the intra- and extraconal fat. Symmetric extraocular muscles. Sinuses: There is mucosal thickening along the floors of both maxillary sinuses, possibly odontogenic in origin. Soft tissues: As above, soft tissue swelling anterior to the right maxilla. Limited intracranial: Normal. IMPRESSION: 1. Severe dental disease of the maxillary teeth, including periapical lucencies at the roots of teeth 5 and 13. There is dehiscence of the anterior maxillary cortex at both locations. On the right, there is overlying soft tissue inflammation with suspected small subperiosteal abscess. 2. Subcutaneous inflammation of the right cheek is favored to indicate odontogenic cellulitis. 3. 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 Electronically Signed   By: KUlyses JarredM.D.   On: 05/03/2018 06:19    Procedures Procedures (including critical care time)  Medications Ordered in ED Medications  clindamycin (CLEOCIN) IVPB 900 mg (900 mg Intravenous New Bag/Given 05/03/18 0501)  ketorolac (TORADOL) 30 MG/ML injection 15 mg (15 mg  Intravenous Given 05/03/18 0500)  iohexol (OMNIPAQUE) 300 MG/ML solution 75 mL (75 mLs  Intravenous Contrast Given 05/03/18 0553)     Initial Impression / Assessment and Plan / ED Course  I have reviewed the triage vital signs and the nursing notes.  Pertinent labs & imaging results that were available during my care of the patient were reviewed by me and considered in my medical decision making (see chart for details).     Patient had blood work done on April 8 with normal BUN and creatinine, she was given Toradol for pain.  She was started on IV clindamycin for suspected dental infection.    I had already reviewed patient's charts prior to go see her.  She was being seen by Dr. Meda Coffee starting in November 2018.  At that time she referred her to pain management.  Dr. Meda Coffee has left and last saw the patient in March and patient started seeing Dr. Criss Rosales on 4/11 at the family practice center at [facility].  He referred her for a mental health evaluation for her depression and also did a pain management referral. Patient states she does remember being to our emergency department twice before tonight.  According to our records she was here on April 8 and March 6.    Also when I look her up on the [facility] there were 2 patients with her name and birthdate, one was in an area around [facility].  She does not recall any doctors there until I point out I had seen she had brought medical records that were downloaded into epic with her address listed as being in [facility].  She then started to tell me the names of the doctors who used to prescribe her medications in that area.  Patient was getting #90 oxycodone 10 mg tablets through March 6 from doctors in [place].  She then started seeing PA Florene Glen and she got #60 oxycodone 5/325 on April 9.  She also got #60 buprenorphine 2 mg tablets sublinguals on May 3, and she was continued on her Lyrica by Ms. Powell.  Patient admits that she ran out of her pain medication early.  She was given the results of  her CT scan, she was advised to use ice, take acetaminophen with the naproxen for pain, take antibiotics until gone, she was given dental resource guide and also referred to oral surgeon Dr. Hoyt Koch.   Final Clinical Impressions(s) / ED Diagnoses   Final diagnoses:  Pain due to dental caries  Dental abscess    ED Discharge Orders        Ordered    clindamycin (CLEOCIN) 150 MG capsule     05/03/18 0628    naproxen (NAPROSYN) 250 MG tablet     05/03/18 8850    OTC acetaminophen  Plan discharge  Rolland Porter, MD, Barbette Or, MD 05/03/18 [phone]

## 2018-05-03 NOTE — Discharge Instructions (Addendum)
Use ice over the swollen areas. Take the antibiotics until gone. You need to see an oral surgeon to have the roots of those broken off teeth extracted.  Take the naproxen with acetaminophen 650 mg 4 times a day for pain. You will need to see your doctor to get into pain management.  Return to the ED if you get a high fever, are unable to swallow or have difficulty breathing.

## 2018-05-04 ENCOUNTER — Inpatient Hospital Stay (HOSPITAL_COMMUNITY): Payer: Medicare Other

## 2018-05-04 ENCOUNTER — Other Ambulatory Visit: Payer: Self-pay

## 2018-05-04 ENCOUNTER — Inpatient Hospital Stay (HOSPITAL_COMMUNITY)
Admission: EM | Admit: 2018-05-04 | Discharge: 2018-05-06 | DRG: 158 | Disposition: A | Discharge status: Home / Self Care | Payer: Medicare Other | Attending: Family Medicine | Admitting: Family Medicine

## 2018-05-04 ENCOUNTER — Encounter (HOSPITAL_COMMUNITY): Payer: Self-pay | Admitting: Emergency Medicine

## 2018-05-04 ENCOUNTER — Emergency Department (HOSPITAL_COMMUNITY): Payer: Medicare Other

## 2018-05-04 DIAGNOSIS — Z7984 Long term (current) use of oral hypoglycemic drugs: Secondary | ICD-10-CM

## 2018-05-04 DIAGNOSIS — Z79899 Other long term (current) drug therapy: Secondary | ICD-10-CM

## 2018-05-04 DIAGNOSIS — T380X5A Adverse effect of glucocorticoids and synthetic analogues, initial encounter: Secondary | ICD-10-CM | POA: Diagnosis present

## 2018-05-04 DIAGNOSIS — J029 Acute pharyngitis, unspecified: Secondary | ICD-10-CM | POA: Diagnosis present

## 2018-05-04 DIAGNOSIS — E1140 Type 2 diabetes mellitus with diabetic neuropathy, unspecified: Secondary | ICD-10-CM | POA: Diagnosis present

## 2018-05-04 DIAGNOSIS — K122 Cellulitis and abscess of mouth: Principal | ICD-10-CM | POA: Diagnosis present

## 2018-05-04 DIAGNOSIS — F4321 Adjustment disorder with depressed mood: Secondary | ICD-10-CM | POA: Diagnosis present

## 2018-05-04 DIAGNOSIS — K029 Dental caries, unspecified: Secondary | ICD-10-CM | POA: Diagnosis present

## 2018-05-04 DIAGNOSIS — E1165 Type 2 diabetes mellitus with hyperglycemia: Secondary | ICD-10-CM

## 2018-05-04 DIAGNOSIS — Z86718 Personal history of other venous thrombosis and embolism: Secondary | ICD-10-CM

## 2018-05-04 DIAGNOSIS — M5416 Radiculopathy, lumbar region: Secondary | ICD-10-CM | POA: Diagnosis not present

## 2018-05-04 DIAGNOSIS — M545 Low back pain: Secondary | ICD-10-CM | POA: Diagnosis present

## 2018-05-04 DIAGNOSIS — Z720 Tobacco use: Secondary | ICD-10-CM | POA: Diagnosis not present

## 2018-05-04 DIAGNOSIS — Z833 Family history of diabetes mellitus: Secondary | ICD-10-CM

## 2018-05-04 DIAGNOSIS — G4733 Obstructive sleep apnea (adult) (pediatric): Secondary | ICD-10-CM | POA: Diagnosis present

## 2018-05-04 DIAGNOSIS — G894 Chronic pain syndrome: Secondary | ICD-10-CM | POA: Diagnosis present

## 2018-05-04 DIAGNOSIS — Z6841 Body Mass Index (BMI) 40.0 and over, adult: Secondary | ICD-10-CM

## 2018-05-04 DIAGNOSIS — Z7901 Long term (current) use of anticoagulants: Secondary | ICD-10-CM | POA: Diagnosis not present

## 2018-05-04 DIAGNOSIS — IMO0002 Reserved for concepts with insufficient information to code with codable children: Secondary | ICD-10-CM | POA: Diagnosis present

## 2018-05-04 DIAGNOSIS — E785 Hyperlipidemia, unspecified: Secondary | ICD-10-CM | POA: Diagnosis present

## 2018-05-04 DIAGNOSIS — F1120 Opioid dependence, uncomplicated: Secondary | ICD-10-CM | POA: Diagnosis present

## 2018-05-04 DIAGNOSIS — G8929 Other chronic pain: Secondary | ICD-10-CM | POA: Diagnosis present

## 2018-05-04 DIAGNOSIS — R590 Localized enlarged lymph nodes: Secondary | ICD-10-CM | POA: Diagnosis present

## 2018-05-04 DIAGNOSIS — I10 Essential (primary) hypertension: Secondary | ICD-10-CM | POA: Diagnosis present

## 2018-05-04 DIAGNOSIS — J449 Chronic obstructive pulmonary disease, unspecified: Secondary | ICD-10-CM | POA: Diagnosis present

## 2018-05-04 DIAGNOSIS — M272 Inflammatory conditions of jaws: Secondary | ICD-10-CM | POA: Diagnosis present

## 2018-05-04 DIAGNOSIS — R220 Localized swelling, mass and lump, head: Secondary | ICD-10-CM | POA: Diagnosis present

## 2018-05-04 DIAGNOSIS — K047 Periapical abscess without sinus: Secondary | ICD-10-CM | POA: Diagnosis present

## 2018-05-04 DIAGNOSIS — F17210 Nicotine dependence, cigarettes, uncomplicated: Secondary | ICD-10-CM | POA: Diagnosis present

## 2018-05-04 DIAGNOSIS — K1379 Other lesions of oral mucosa: Secondary | ICD-10-CM | POA: Diagnosis not present

## 2018-05-04 DIAGNOSIS — D72829 Elevated white blood cell count, unspecified: Secondary | ICD-10-CM | POA: Diagnosis present

## 2018-05-04 DIAGNOSIS — L03211 Cellulitis of face: Secondary | ICD-10-CM | POA: Diagnosis present

## 2018-05-04 HISTORY — DX: Periapical abscess without sinus: K04.7

## 2018-05-04 LAB — CBC WITH DIFFERENTIAL/PLATELET
BASOS PCT: 0 %
Basophils Absolute: 0 10*3/uL (ref 0.0–0.1)
EOS ABS: 0.1 10*3/uL (ref 0.0–0.7)
Eosinophils Relative: 1 %
HCT: 35.2 % — ABNORMAL LOW (ref 36.0–46.0)
HEMOGLOBIN: 10.6 g/dL — AB (ref 12.0–15.0)
LYMPHS ABS: 3.8 10*3/uL (ref 0.7–4.0)
Lymphocytes Relative: 30 %
MCH: 25.7 pg — AB (ref 26.0–34.0)
MCHC: 30.1 g/dL (ref 30.0–36.0)
MCV: 85.4 fL (ref 78.0–100.0)
MONO ABS: 0.7 10*3/uL (ref 0.1–1.0)
MONOS PCT: 5 %
NEUTROS PCT: 64 %
Neutro Abs: 8 10*3/uL (ref 1.7–7.7)
Platelets: 256 10*3/uL (ref 150–400)
RBC: 4.12 MIL/uL (ref 3.87–5.11)
RDW: 18.3 % — AB (ref 11.5–15.5)
WBC: 12.6 10*3/uL — ABNORMAL HIGH (ref 4.0–10.5)

## 2018-05-04 LAB — CBG MONITORING, ED: Glucose-Capillary: 225 mg/dL — ABNORMAL HIGH (ref 65–99)

## 2018-05-04 LAB — BASIC METABOLIC PANEL
Anion gap: 11 (ref 5–15)
BUN: 9 mg/dL (ref 6–20)
CALCIUM: 9.5 mg/dL (ref 8.9–10.3)
CHLORIDE: 109 mmol/L (ref 101–111)
CO2: 24 mmol/L (ref 22–32)
CREATININE: 0.67 mg/dL (ref 0.44–1.00)
GFR calc Af Amer: >60 mL/min (ref >60)
GFR calc non Af Amer: >60 mL/min (ref >60)
Glucose, Bld: 140 mg/dL — ABNORMAL HIGH (ref 65–99)
Potassium: 3.9 mmol/L (ref 3.5–5.1)
SODIUM: 144 mmol/L (ref 135–145)

## 2018-05-04 LAB — GLUCOSE, CAPILLARY
Glucose-Capillary: 260 mg/dL — ABNORMAL HIGH (ref 65–99)
Glucose-Capillary: 347 mg/dL — ABNORMAL HIGH (ref 65–99)

## 2018-05-04 LAB — MRSA PCR SCREENING: MRSA BY PCR: NEGATIVE

## 2018-05-04 LAB — HEMOGLOBIN A1C
HEMOGLOBIN A1C: 8.9 % — AB (ref 4.8–5.6)
MEAN PLASMA GLUCOSE: 208.73 mg/dL

## 2018-05-04 MED ORDER — DULOXETINE HCL 60 MG PO CPEP
60.0000 mg | ORAL_CAPSULE | Freq: Every day | ORAL | Status: DC
  Administered 2018-05-05 – 2018-05-06 (×2): 60 mg via ORAL
  Filled 2018-05-04 (×2): qty 1

## 2018-05-04 MED ORDER — IPRATROPIUM-ALBUTEROL 0.5-2.5 (3) MG/3ML IN SOLN: 3.0000 mL | Freq: Four times a day (QID) | RESPIRATORY_TRACT | Status: DC | PRN

## 2018-05-04 MED ORDER — DEXAMETHASONE SODIUM PHOSPHATE 10 MG/ML IJ SOLN
5.0000 mg | Freq: Four times a day (QID) | INTRAMUSCULAR | Status: AC
  Administered 2018-05-04 – 2018-05-05 (×2): 5 mg via INTRAVENOUS
  Filled 2018-05-04 (×2): qty 1

## 2018-05-04 MED ORDER — ACETAMINOPHEN 325 MG PO TABS
650.0000 mg | ORAL_TABLET | Freq: Four times a day (QID) | ORAL | Status: DC | PRN
  Administered 2018-05-04: 650 mg via ORAL
  Filled 2018-05-04: qty 2

## 2018-05-04 MED ORDER — INSULIN ASPART 100 UNIT/ML ~~LOC~~ SOLN
0.0000 [IU] | Freq: Three times a day (TID) | SUBCUTANEOUS | Status: DC
  Administered 2018-05-04 – 2018-05-05 (×2): 8 [IU] via SUBCUTANEOUS
  Administered 2018-05-05 (×2): 5 [IU] via SUBCUTANEOUS
  Administered 2018-05-06: 3 [IU] via SUBCUTANEOUS
  Administered 2018-05-06: 5 [IU] via SUBCUTANEOUS

## 2018-05-04 MED ORDER — INSULIN ASPART 100 UNIT/ML ~~LOC~~ SOLN
0.0000 [IU] | Freq: Every day | SUBCUTANEOUS | Status: DC
  Administered 2018-05-04: 4 [IU] via SUBCUTANEOUS
  Administered 2018-05-05: 2 [IU] via SUBCUTANEOUS

## 2018-05-04 MED ORDER — ONDANSETRON HCL 4 MG/2ML IJ SOLN
4.0000 mg | Freq: Four times a day (QID) | INTRAMUSCULAR | Status: DC | PRN
  Administered 2018-05-04 – 2018-05-05 (×2): 4 mg via INTRAVENOUS
  Filled 2018-05-04 (×2): qty 2

## 2018-05-04 MED ORDER — ONDANSETRON HCL 4 MG PO TABS: 4.0000 mg | ORAL_TABLET | Freq: Four times a day (QID) | ORAL | Status: DC | PRN

## 2018-05-04 MED ORDER — CLINDAMYCIN PHOSPHATE 600 MG/50ML IV SOLN
600.0000 mg | Freq: Once | INTRAVENOUS | Status: AC
  Administered 2018-05-04: 600 mg via INTRAVENOUS
  Filled 2018-05-04: qty 50

## 2018-05-04 MED ORDER — IOHEXOL 300 MG/ML  SOLN
75.0000 mL | Freq: Once | INTRAMUSCULAR | Status: AC | PRN
  Administered 2018-05-04: 75 mL via INTRAVENOUS

## 2018-05-04 MED ORDER — NICOTINE 14 MG/24HR TD PT24
14.0000 mg | MEDICATED_PATCH | Freq: Every day | TRANSDERMAL | Status: DC
  Administered 2018-05-04 – 2018-05-06 (×3): 14 mg via TRANSDERMAL
  Filled 2018-05-04 (×3): qty 1

## 2018-05-04 MED ORDER — ATORVASTATIN CALCIUM 40 MG PO TABS
40.0000 mg | ORAL_TABLET | Freq: Every day | ORAL | Status: DC
  Administered 2018-05-05: 40 mg via ORAL
  Filled 2018-05-04 (×2): qty 1

## 2018-05-04 MED ORDER — SODIUM CHLORIDE 0.9 % IV BOLUS
1000.0000 mL | Freq: Once | INTRAVENOUS | Status: AC
  Administered 2018-05-04: 1000 mL via INTRAVENOUS

## 2018-05-04 MED ORDER — MORPHINE SULFATE (PF) 4 MG/ML IV SOLN
4.0000 mg | Freq: Once | INTRAVENOUS | Status: AC
  Administered 2018-05-04: 4 mg via INTRAVENOUS
  Filled 2018-05-04: qty 1

## 2018-05-04 MED ORDER — LOSARTAN POTASSIUM 50 MG PO TABS
100.0000 mg | ORAL_TABLET | Freq: Every day | ORAL | Status: DC
  Administered 2018-05-05 – 2018-05-06 (×2): 100 mg via ORAL
  Filled 2018-05-04 (×2): qty 2

## 2018-05-04 MED ORDER — CLINDAMYCIN PHOSPHATE 600 MG/50ML IV SOLN
600.0000 mg | Freq: Three times a day (TID) | INTRAVENOUS | Status: DC
  Administered 2018-05-04 – 2018-05-06 (×5): 600 mg via INTRAVENOUS
  Filled 2018-05-04 (×5): qty 50

## 2018-05-04 MED ORDER — SODIUM CHLORIDE 0.9 % IV SOLN
INTRAVENOUS | Status: DC
  Administered 2018-05-04: 16:00:00 via INTRAVENOUS
  Administered 2018-05-05: 1000 mL via INTRAVENOUS
  Administered 2018-05-06: 04:00:00 via INTRAVENOUS

## 2018-05-04 MED ORDER — ASPIRIN 325 MG PO TABS
325.0000 mg | ORAL_TABLET | Freq: Once | ORAL | Status: DC
  Filled 2018-05-04: qty 1

## 2018-05-04 MED ORDER — DEXAMETHASONE SODIUM PHOSPHATE 4 MG/ML IJ SOLN
8.0000 mg | Freq: Once | INTRAMUSCULAR | Status: AC
  Administered 2018-05-04: 8 mg via INTRAVENOUS
  Filled 2018-05-04: qty 2

## 2018-05-04 MED ORDER — SENNOSIDES-DOCUSATE SODIUM 8.6-50 MG PO TABS: 1.0000 | ORAL_TABLET | Freq: Every evening | ORAL | Status: DC | PRN

## 2018-05-04 MED ORDER — RIVAROXABAN 20 MG PO TABS
20.0000 mg | ORAL_TABLET | Freq: Every day | ORAL | Status: DC
  Administered 2018-05-04: 20 mg via ORAL
  Filled 2018-05-04: qty 1

## 2018-05-04 MED ORDER — ONDANSETRON HCL 4 MG/2ML IJ SOLN
4.0000 mg | Freq: Once | INTRAMUSCULAR | Status: AC
  Administered 2018-05-04: 4 mg via INTRAVENOUS
  Filled 2018-05-04: qty 2

## 2018-05-04 MED ORDER — INSULIN GLARGINE 100 UNIT/ML ~~LOC~~ SOLN
15.0000 [IU] | Freq: Every day | SUBCUTANEOUS | Status: DC
Start: 2018-05-04 — End: 2018-05-05
  Administered 2018-05-04: 15 [IU] via SUBCUTANEOUS
  Filled 2018-05-04: qty 0.15

## 2018-05-04 MED ORDER — SACCHAROMYCES BOULARDII 250 MG PO CAPS
250.0000 mg | ORAL_CAPSULE | Freq: Two times a day (BID) | ORAL | Status: DC
  Administered 2018-05-05 – 2018-05-06 (×3): 250 mg via ORAL
  Filled 2018-05-04 (×3): qty 1

## 2018-05-04 MED ORDER — IPRATROPIUM-ALBUTEROL 0.5-2.5 (3) MG/3ML IN SOLN: 3.0000 mL | Freq: Four times a day (QID) | RESPIRATORY_TRACT | Status: DC

## 2018-05-04 MED ORDER — PREGABALIN 100 MG PO CAPS
200.0000 mg | ORAL_CAPSULE | Freq: Two times a day (BID) | ORAL | Status: DC
  Administered 2018-05-04 – 2018-05-06 (×4): 200 mg via ORAL
  Filled 2018-05-04 (×4): qty 2

## 2018-05-04 MED ORDER — ACETAMINOPHEN 650 MG RE SUPP: 650.0000 mg | Freq: Four times a day (QID) | RECTAL | Status: DC | PRN

## 2018-05-04 MED ORDER — INSULIN ASPART 100 UNIT/ML ~~LOC~~ SOLN
4.0000 [IU] | Freq: Three times a day (TID) | SUBCUTANEOUS | Status: DC
  Administered 2018-05-04 – 2018-05-06 (×6): 4 [IU] via SUBCUTANEOUS

## 2018-05-04 MED ORDER — MORPHINE SULFATE (PF) 4 MG/ML IV SOLN
4.0000 mg | INTRAVENOUS | Status: DC | PRN
  Administered 2018-05-04 – 2018-05-05 (×5): 4 mg via INTRAVENOUS
  Filled 2018-05-04 (×5): qty 1

## 2018-05-04 NOTE — ED Notes (Signed)
Significant decrease in facial swelling noted.

## 2018-05-04 NOTE — ED Notes (Signed)
Pt returned from CT °

## 2018-05-04 NOTE — ED Notes (Signed)
Pt given ice chips with EDP approval.  

## 2018-05-04 NOTE — ED Provider Notes (Signed)
[facility] EMERGENCY DEPARTMENT Provider Note   CSN: [id number] Arrival date & time: 05/04/18  0957     History   Chief Complaint Chief Complaint  Patient presents with  . Facial Swelling    HPI Linda Gonzalez is a 57 y.o. female.  Level 5 caveat for urgent need for intervention.  Patient presents with persistent right facial swelling and erythema for the past 24 to 36 hours.  She has known poor dentition with broken teeth.  She was evaluated last night in the emergency department and started on clindamycin.  Symptoms have worsened overnight.  She feels a minimal amount of pressure in the right side of the throat.  No obvious airway distress.     Past Medical History:  Diagnosis Date  . Arthritis   . Asthma   . Chronic back pain   . COPD (chronic obstructive pulmonary disease) ([place])   . Dental abscess   . Diabetes mellitus without complication ([place])   . DVT, lower extremity ([place])   . Hyperlipidemia   . Hypertension   . Lumbar radiculopathy   . Neuromuscular disorder ([place])   . OSA (obstructive sleep apnea) 10/10/2017  . Situational depression 10/04/2017    Patient Active Problem List   Diagnosis Date Noted  . Abscess of oral space 05/04/2018  . Dental caries 05/04/2018  . Facial swelling 05/04/2018  . Leukocytosis 05/04/2018  . Subperiosteal abscess of jaw 05/04/2018  . Facial cellulitis 05/04/2018  . Adenopathy, cervical 05/04/2018  . Soft palate edema 05/04/2018  . Oropharynx infection 05/04/2018  . Chronic pain syndrome 03/09/2018  . Aortic atherosclerosis ([place]) 01/31/2018  . Vitamin D deficiency 10/10/2017  . OSA (obstructive sleep apnea) 10/10/2017  . Chronic gastritis 10/10/2017  . Tubular adenoma of colon 10/10/2017  . Tobacco abuse 10/04/2017  . Situational depression 10/04/2017  . HTN (hypertension) 10/04/2017  . HLD (hyperlipidemia) 10/04/2017  . Uncontrolled type 2 diabetes mellitus ([place]) 10/04/2017  . Class 2 obesity due to excess calories  with body mass index (BMI) of 39.0 to 39.9 in adult 10/04/2017  . Chronic radicular low back pain 10/04/2017  . 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 History of DVT of lower extremity 10/04/2017    Past Surgical History:  Procedure Laterality Date  . BACK SURGERY  2015  . EXPLORATORY LAPAROTOMY    . FRACTURE SURGERY    . SHOULDER SURGERY  2010   right  . SPINE SURGERY     LUMBAR SPINE     OB History   None      Home Medications    Prior to Admission medications   Medication Sig Start Date End Date Taking? Authorizing Provider  atorvastatin (LIPITOR) 40 MG tablet Take 1 tablet (40 mg total) by mouth daily. 03/07/18  Yes Bland, Scott, DO  blood glucose meter kit and supplies KIT Use up to four times daily as directed. 04/02/18  Yes Dickie La, MD  Cholecalciferol (VITAMIN D PO) Take 1 capsule by mouth daily.   Yes [provider]  DULoxetine (CYMBALTA) 60 MG capsule Take 1 capsule (60 mg total) by mouth daily. 03/07/18  Yes Bland, Scott, DO  glipiZIDE (GLUCOTROL XL) 5 MG 24 hr tablet Take 1 tablet (5 mg total) by mouth daily with breakfast. 03/07/18  Yes Bland, Scott, DO  losartan (COZAAR) 100 MG tablet Take 1 tablet (100 mg total) by mouth daily. 03/07/18  Yes Bland, Scott, DO  LYRICA 200 MG capsule Take 1 capsule  by mouth 2 (two) times daily.  03/27/18  Yes [provider]  metFORMIN (GLUCOPHAGE XR) 750 MG 24 hr tablet Take 1 tablet (750 mg total) by mouth 2 (two) times daily. 03/07/18  Yes Sherene Sires, DO  methocarbamol (ROBAXIN) 750 MG tablet Take 1 tablet by mouth 3 (three) times daily as needed. 03/27/18  Yes [provider]  rivaroxaban (XARELTO) 20 MG TABS tablet Take 1 tablet (20 mg total) by mouth daily with supper. 03/07/18  Yes Sherene Sires, DO    Family History Family History  Problem Relation Age of Onset  . Alcohol abuse Mother   . Arthritis Mother   . Mental illness Mother   . Diabetes Father   . Heart disease  Father 51  . Heart disease Brother 16  . Learning disabilities Son   . Tuberous sclerosis Son     Social History Social History   Tobacco Use  . Smoking status: Current Every Day Smoker    Packs/day: 1.00    Types: Cigarettes    Start date: 11/27/1978  . Smokeless tobacco: Never Used  Substance Use Topics  . Alcohol use: No    Frequency: Never  . Drug use: No     Allergies   Ace inhibitors; Phenergan [promethazine hcl]; Repaglinide; and Vancomycin   Review of Systems Review of Systems  All other systems reviewed and are negative.    Physical Exam Updated Vital Signs BP (!) 116/56   Pulse 80   Temp 98.6 F (37 C) (Oral)   Resp 15   Ht 5' 6"  (1.676 m)   Wt 113.4 kg (250 lb)   LMP 11/02/2012 (Approximate)   SpO2 94%   BMI 40.35 kg/m   Physical Exam  Constitutional: She is oriented to person, place, and time. She appears well-developed and well-nourished.  HENT:  Obvious edema and erythema on the right cheek and her right anterior neck.  Eyes: Conjunctivae are normal.  Neck: Neck supple.  Cardiovascular: Normal rate and regular rhythm.  Pulmonary/Chest: Effort normal and breath sounds normal.  Abdominal: Soft. Bowel sounds are normal.  Musculoskeletal: Normal range of motion.  Neurological: She is alert and oriented to person, place, and time.  Skin: Skin is warm and dry.  Psychiatric: She has a normal mood and affect. Her behavior is normal.  Nursing note and vitals reviewed.    ED Treatments / Results  Labs (all labs ordered are listed, but only abnormal results are displayed) Labs Reviewed  CBC WITH DIFFERENTIAL/PLATELET - Abnormal; Notable for the following components:      Result Value   WBC 12.6 (*)    Hemoglobin 10.6 (*)    HCT 35.2 (*)    MCH 25.7 (*)    RDW 18.3 (*)    All other components within normal limits  BASIC METABOLIC PANEL - Abnormal; Notable for the following components:   Glucose, Bld 140 (*)    All other components within  normal limits    EKG None  Radiology Ct Soft Tissue Neck W Contrast  Result Date: 05/04/2018 CLINICAL DATA:  Progressive facial swelling.  Shortness of breath. EXAM: CT NECK WITH CONTRAST TECHNIQUE: Multidetector CT imaging of the neck was performed using the standard protocol following the bolus administration of intravenous contrast. CONTRAST:  20m OMNIPAQUE IOHEXOL 300 MG/ML  SOLN COMPARISON:  CT of the face 05/03/2018. FINDINGS: Pharynx and larynx: Prominence of the palatine tonsils has increased since the prior exam. The soft palate is edematous. This narrows the nasopharyngeal  airway. The oropharyngeal airway is mildly narrowed, but not obstructed. No focal mucosal or submucosal lesions are present. Salivary glands: The submandibular and parotid glands are within normal limits bilaterally. Thyroid: Thyroid is mildly heterogeneous without a focal lesion. Lymph nodes: Enlarged bilateral level 2 lymph nodes are likely reactive, more prominent right than left. Subcentimeter submandibular lymph nodes and submental lymph nodes are stable. Vascular: Atherosclerotic changes are noted at the arch and proximal great vessels without significant stenosis. Limited intracranial: Negative Visualized orbits: Right greater than left periorbital soft tissue swelling is new since the prior study. This is most prominent lateral and inferior to the right orbit. Globes are unremarkable. Mastoids and visualized paranasal sinuses: Mild mucosal thickening the right maxillary sinus is increased. A small polyp or mucous retention cyst is present in the left maxillary sinus. The remaining paranasal sinuses and the mastoid air cells are clear Skeleton: Degenerative endplate changes are again noted C5-6 and C6-7. Dental caries are present at every remaining maxillary tooth. Periapical lucencies are again noted at teeth numbers 5, 6, 12, and 14. There is cortical erosion at the roots of #5 and 6 with adjacent subperiosteal abscess.  The subperiosteal abscess has increased in size since the prior exam, now measuring 18 x 22 mm. Upper chest: The lung apices are clear. The thoracic inlet is within normal limits. Other: Soft swelling over the right side of the face has increased. There is increased inflammatory change along the right nasal labial fold extending to the inferior periorbital region as described. Diffuse subcutaneous soft tissue stranding is present over the right side of the face and to the anterior chin bilaterally, right greater than left. There is thickening of the platysma. IMPRESSION: 1. Increased diffuse soft tissue swelling over the right side of the face, now including periorbital inflammatory changes bilaterally, right greater than left. This is compatible with progressive cellulitis. 2. Increasing size of subperiosteal abscess adjacent to the odontogenic source along the right-sided mandible. The collection now measures 18 x 22 mm. 3. Increased soft tissue swelling inflammation along the right upper lip and nasal labial fold extending to the inferior periorbital region. 4. Soft tissue swelling anterior to the mandible. 5. Reactive adenopathy. 6. Edematous changes within the soft palate contribute to narrowing of the nasopharyngeal airway. The oropharyngeal airway is narrowed, but patent. Electronically Signed   By: San Morelle M.D.   On: 05/04/2018 13:23   Ct Maxillofacial W Contrast  Result Date: 05/03/2018 CLINICAL DATA:  Dental pain.  Right facial swelling. EXAM: CT MAXILLOFACIAL WITH CONTRAST TECHNIQUE: Multidetector CT imaging of the maxillofacial structures was performed with intravenous contrast. Multiplanar CT image reconstructions were also generated. CONTRAST:  30m OMNIPAQUE IOHEXOL 300 MG/ML  SOLN COMPARISON:  None. FINDINGS: Osseous: No facial fracture. Dental: There is severe dental disease with multiple large caries and numerous missing teeth. All of the maxillary teeth have large caries. There is  a periapical lucency at the root of tooth 5 with dehiscence of the anterior maxillary cortex. There is moderate overlying soft tissue swelling that extends into the subcutaneous facial fat. Small, crescentic hypoattenuation overlying the dehiscence may indicate a small subperiosteal abscess. There is also a large periapical lucency at the root of tooth 13. There is dental amalgam within multiple mandibular teeth, which are generally in better condition than the upper teeth. There is partial destruction of the most posterior remaining right mandibular tooth with an associated periapical lucency. Orbits: The globes are intact. Normal appearance of the intra- and extraconal  fat. Symmetric extraocular muscles. Sinuses: There is mucosal thickening along the floors of both maxillary sinuses, possibly odontogenic in origin. Soft tissues: As above, soft tissue swelling anterior to the right maxilla. Limited intracranial: Normal. IMPRESSION: 1. Severe dental disease of the maxillary teeth, including periapical lucencies at the roots of teeth 5 and 13. There is dehiscence of the anterior maxillary cortex at both locations. On the right, there is overlying soft tissue inflammation with suspected small subperiosteal abscess. 2. Subcutaneous inflammation of the right cheek is favored to indicate odontogenic cellulitis. 3. 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 Electronically Signed   By: Ulyses Jarred M.D.   On: 05/03/2018 06:19    Procedures Procedures (including critical care time)  Medications Ordered in ED Medications  sodium chloride 0.9 % bolus 1,000 mL (0 mLs Intravenous Stopped 05/04/18 1307)  ondansetron (ZOFRAN) injection 4 mg (4 mg Intravenous Given 05/04/18 1104)  morphine 4 MG/ML injection 4 mg (4 mg Intravenous Given 05/04/18 1104)  clindamycin (CLEOCIN) IVPB 600 mg (0 mg Intravenous Stopped 05/04/18 1139)  dexamethasone (DECADRON) injection 8 mg (8 mg  Intravenous Given 05/04/18 1104)  iohexol (OMNIPAQUE) 300 MG/ML solution 75 mL (75 mLs Intravenous Contrast Given 05/04/18 1242)     Initial Impression / Assessment and Plan / ED Course  I have reviewed the triage vital signs and the nursing notes.  Pertinent labs & imaging results that were available during my care of the patient were reviewed by me and considered in my medical decision making (see chart for details).     Patient with known dental pathology presents with worsening erythema and edema in the right cheek and neck.  CT of neck reveals increasing cellulitis and a 18 x 22 mm abscess.  IV fluids, pain meds, steroids, antibiotics.  Gust with oral surgeon on-call Dr. Hoyt Koch.  Will admit to general medicine and transfer to [facility].  Discussed with Dr. Wynetta Emery.  Her airway is intact at discharge.   CRITICAL CARE Performed by: Nat Christen Total critical care time: 30 minutes Critical care time was exclusive of separately billable procedures and treating other patients. Critical care was necessary to treat or prevent imminent or life-threatening deterioration. Critical care was time spent personally by me on the following activities: development of treatment plan with patient and/or surrogate as well as nursing, discussions with consultants, evaluation of patient's response to treatment, examination of patient, obtaining history from patient or surrogate, ordering and performing treatments and interventions, ordering and review of laboratory studies, ordering and review of radiographic studies, pulse oximetry and re-evaluation of patient's condition.  Final Clinical Impressions(s) / ED Diagnoses   Final diagnoses:  Dental abscess  Facial cellulitis    ED Discharge Orders    None       Nat Christen, MD 05/04/18 1513

## 2018-05-04 NOTE — ED Triage Notes (Signed)
Pt c/o worsening facial swelling with SOB that began this morning. Pt was seen in ED yesterday for abscessed tooth. States when she woke up this morning the swelling and redness had spread and she felt SOB. EMS reports wheezing so she received a neb tx PTA.

## 2018-05-04 NOTE — H&P (Addendum)
[facility] Admission History and Physical Service Pager: [phone]  Patient name: Linda Gonzalez Medical record number: [id number] Date of birth: May 11, 1961 Age: 57 y.o. Gender: female  Primary Care Provider: Sherene Sires, DO Consultants: oral surgery Code Status: full  Chief Complaint: facial abcess  Assessment and Plan: Linda Gonzalez is a 57 y.o. female presenting with pain, swelling, and throat pressure from a suspected mandibular abscess . 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 Initially discharged from [facility] but presented with worsening symptoms. Failed outpatient treatment with clindamycin.  Increased WBC, edema, and throat soreness. Transferred to [facility] for oral surgery evaluation. Per oral surgery note, plan for I&D with dental extractions on 6/9. Will continue decadron to decrease throat swelling. Will hold xarelt. Did receive PM dose tonight. Morphine for pain control. Clinda for coverage of typical dental flora. No signs of airway compromise. Ludwig angina less likely but top concern. Does not appear septic. - admit to family medicine teaching service, dr. Andria Frames, inpatient - vital signs per stepdown routine - NPO at midnight - holding xarelto at this time - follow up orthopantogram - continue clindamycin 610m q 8 hours - zofran for nausea - decadron 562mq 6 hours to reduce throat swelling - morphine [id number]  History of DVTs Patient chronically on xarelto. Did receive dose tonight but will need to hold until ok with oral surgery post operatively. - holding xarelto given plan for I&D 6/9  Type II diabetes A1C 8.9. Better from last A1c which was >11. Lantus 15 units. Likely increased cbg due to steroids.  MSSI. Takes glipizide and metformin at home. Likely would benefit from switching glipizide to another oral agent to decrease risk for hypoglycemia.  OSA CPAP nightly  Hypertension Well controlled since arrival at [facility]. 126/46 at last check. Continue losartan 100m20mily  Asthma  No medications at home. Duonebs 3mL 46m hours prn. No wheezing appreciated on exam.  Tobacco dependence Nicotine patch 14mg 49m Continue lipitor 40mg d35m  FEN/GI: npo at midnight Prophylaxis: xarelto, holding for surgery  Disposition: [facility] home  History of Present Illness:  Linda RAHUVA POYNOR6 y.o.18emale presenting with facial swelling and dental pain. Patient initially seen in ED on 6/7 and diagnosed with severe dental caries and facial abscess. Maxillary CT at the time showed dental disease and suspected subperiosteal abscess. Patient was given prescription for clindamycin and was told to follow up with oral surgeon.  Patient returned to the ed on 6/8 with worsening facial edema, erythema, and pressure on right side of throat. CT of head and neck showed increasing edema and worsening abscess and cellulitis. Also found to have narrowing of oropharyngeal airway. [facility]  Workup in the ed consisted of the above scans. Also had a cmp, cbc, a1c. Significant for wbx 12.6, glucose 140,  a1c 8.9.  Review Of Systems: Per HPI  Patient Active Problem List   Diagnosis Date Noted  . Abscess of oral space 05/04/2018  . Dental caries 05/04/2018  . Facial swelling 05/04/2018  . Leukocytosis 05/04/2018  . Subperiosteal abscess of jaw 05/04/2018  . Facial cellulitis 05/04/2018  . Adenopathy, cervical 05/04/2018  . Soft palate edema 05/04/2018  . Oropharynx  infection 05/04/2018  . Anticoagulated 05/04/2018  . Chronic pain syndrome 03/09/2018  . Aortic atherosclerosis ([place]) 01/31/2018  . Vitamin D deficiency 10/10/2017  . OSA (obstructive  sleep apnea) 10/10/2017  . Chronic gastritis 10/10/2017  . Tubular adenoma of colon 10/10/2017  . Tobacco abuse 10/04/2017  . Situational depression 10/04/2017  . HTN (hypertension) 10/04/2017  . HLD (hyperlipidemia) 10/04/2017  . Uncontrolled type 2 diabetes mellitus ([place]) 10/04/2017  . Class 2 obesity due to excess calories with body mass index (BMI) of 39.0 to 39.9 in adult 10/04/2017  . Chronic radicular low back pain 10/04/2017  . 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 History of DVT of lower extremity 10/04/2017    Past Medical History: Past Medical History:  Diagnosis Date  . Arthritis   . Asthma   . Chronic back pain   . COPD (chronic obstructive pulmonary disease) ([place])   . Dental abscess   . Diabetes mellitus without complication ([place])   . DVT, lower extremity ([place])   . Hyperlipidemia   . Hypertension   . Lumbar radiculopathy   . Neuromuscular disorder ([place])   . OSA (obstructive sleep apnea) 10/10/2017  . Situational depression 10/04/2017    Past Surgical History: Past Surgical History:  Procedure Laterality Date  . BACK SURGERY  2015  . EXPLORATORY LAPAROTOMY    . FRACTURE SURGERY    . SHOULDER SURGERY  2010   right  . SPINE SURGERY     LUMBAR SPINE    Social History: Social History   Tobacco Use  . Smoking status: Current Every Day Smoker    Packs/day: 1.00    Types: Cigarettes    Start date: 11/27/1978  . Smokeless tobacco: Never Used  Substance Use Topics  . Alcohol use: No    Frequency: Never  . Drug use: No    Family History: Family History  Problem Relation Age of Onset  . Alcohol abuse Mother   . Arthritis Mother   . Mental illness Mother   . Diabetes Father   . Heart disease Father 21  . Heart disease Brother 97  . Learning disabilities Son   . Tuberous sclerosis Son     Allergies and Medications: Allergies  Allergen Reactions  . Ace Inhibitors Cough  . Phenergan [Promethazine Hcl] Rash  . Repaglinide  Nausea And Vomiting  . Vancomycin Rash   No current facility-administered medications on file prior to encounter.    Current Outpatient Medications on File Prior to Encounter  Medication Sig Dispense Refill  . atorvastatin (LIPITOR) 40 MG tablet Take 1 tablet (40 mg total) by mouth daily. 90 tablet 3  . blood glucose meter kit and supplies KIT Use up to four times daily as directed. 1 each 0  . Cholecalciferol (VITAMIN D PO) Take 1 capsule by mouth daily.    . DULoxetine (CYMBALTA) 60 MG capsule Take 1 capsule (60 mg total) by mouth daily. 30 capsule 2  . glipiZIDE (GLUCOTROL XL) 5 MG 24 hr tablet Take 1 tablet (5 mg total) by mouth daily with breakfast. 30 tablet 2  . losartan (COZAAR) 100 MG tablet Take 1 tablet (100 mg total) by mouth daily. 30 tablet 2  . LYRICA 200 MG capsule Take 1 capsule by mouth 2 (two) times daily.   0  . metFORMIN (GLUCOPHAGE XR) 750 MG 24 hr tablet Take 1 tablet (750 mg total) by mouth 2 (two) times daily. 60 tablet 6  . methocarbamol (ROBAXIN) 750 MG tablet  Take 1 tablet by mouth 3 (three) times daily as needed.  0  . rivaroxaban (XARELTO) 20 MG TABS tablet Take 1 tablet (20 mg total) by mouth daily with supper. 30 tablet 2    Objective: BP (!) 126/46 (BP Location: Left Arm)   Pulse 73   Temp 98 F (36.7 C) (Oral)   Resp 18   Ht 5' 6"  (1.676 m)   Wt 250 lb (113.4 kg)   LMP 11/02/2012 (Approximate)   SpO2 95%   BMI 40.35 kg/m  Exam: General: resting comfortably in bed, no acute distress. Caucasian female. HEENT: edematous right side of face. PERRLA, EOMI. Very poor dentition. Able to open saw 50% due to tenderness, no pharyngeal edema Neck: range of motion intact Respiratory: Lungs CTAB, no increased work of breathing Cardiac: rrr, no m/r/g. Palpable peripheral pulses GI: NT, ND Neuro: CN 2-12 intact, no focal neuro deficits Psych: pleasant, appropriate  Labs and Imaging: CBC BMET  Recent Labs  Lab 05/04/18 1103  WBC 12.6*  HGB 10.6*  HCT  35.2*  PLT 256   Recent Labs  Lab 05/04/18 1103  NA 144  K 3.9  CL 109  CO2 24  BUN 9  CREATININE 0.67  GLUCOSE 140*  CALCIUM 9.5      Guadalupe Dawn, MD 05/04/2018, 9:47 PM PGY-1, [place] [facility] pager: [phone], text pages welcome  Upper Level Addendum: I have seen and evaluated this patient along with Dr. Kris Mouton and reviewed the above note, making necessary revisions in blue.  Harriet Butte, [place], PGY-2

## 2018-05-04 NOTE — H&P (Signed)
History and Physical  Linda Gonzalez SNK:[id number] DOB: 1961/06/24 DOA: 05/04/2018  Referring physician: Sabra Heck PCP: Sherene Sires, DO   Chief Complaint: facial swelling   HPI: Linda Gonzalez is a 57 y.o. 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  The patient was seen in ED on 6/7 and diagnosed with severe dental caries and facial abscess.  She had a maxillary CT scan that showed severe maxillary dental disease and a suspected small subperiosteal abscess, chronic odontogenic sinusitis with subcutaneous inflammation of the right cheek.  The patient was given a prescription for clindamycin and discharged home to follow up with oral surgeon Dr. Hoyt Koch.    The patient returned to the ER today with complaints of worsening facial edema, right facial erythema, edema, malaise, pressure felt in the right side of the throat but no difficulty with breathing or swallowing.  She denies fever and chills. She says that she had been taking the medication.  She was sent for a CT of the neck and this study remarked worsening edema of right side of the face, progressive cellulitis, increasing subperiosteal abscess, edema of the soft palate and narrowing of the oropharyngeal airway.  Her WBC is trending higher at 12.6.  Lactic acid was not tested.  She was afebrile with normal blood pressure.  Blood sugar was slightly elevated at 140.  She was given IV fluids, steroids, antibiotics, and pain medications.    The case was discussed with oral surgeon Dr. Diona Browner who recommended that patient be admitted to general medicine and transferred to [facility] and he would do a formal consult.  She is being admitted to stepdown ICU to  monitor airway protection given the oropharyngeal edema and narrowing.  The inpatient family practice service will take over care when she arrives at [facility].    Review of Systems: All systems reviewed and apart from history of presenting illness, are negative.  Past Medical History:  Diagnosis Date  . Arthritis   . Asthma   . Chronic back pain   . COPD (chronic obstructive pulmonary disease) ([place])   . Dental abscess   . Diabetes mellitus without complication ([place])   . DVT, lower extremity ([place])   . Hyperlipidemia   . Hypertension   . Lumbar radiculopathy   . Neuromuscular disorder ([place])   . OSA (obstructive sleep apnea) 10/10/2017  . Situational depression 10/04/2017   Past Surgical History:  Procedure Laterality Date  . BACK SURGERY  2015  . EXPLORATORY LAPAROTOMY    . FRACTURE SURGERY    . SHOULDER SURGERY  2010   right  . SPINE SURGERY     LUMBAR SPINE   Social History:  reports that she has been smoking cigarettes.  She started smoking about 39 years ago. She has been smoking about 1.00 pack per day. She has never used smokeless tobacco. She reports that she does not drink alcohol or use drugs.  Allergies  Allergen Reactions  . Ace Inhibitors Cough  . Phenergan [Promethazine Hcl] Rash  . Repaglinide Nausea And Vomiting  . Vancomycin Rash    Family History  Problem Relation Age of Onset  . Alcohol abuse Mother   . Arthritis Mother   . Mental illness Mother   .  Diabetes Father   . Heart disease Father 23  . Heart disease Brother 80  . Learning disabilities Son   . Tuberous sclerosis Son     Prior to Admission medications   Medication Sig Start Date End Date Taking? Authorizing Provider  atorvastatin (LIPITOR) 40 MG tablet Take 1 tablet (40 mg total) by mouth daily. 03/07/18  Yes Bland, Scott, DO  blood glucose meter kit and supplies KIT Use up to four times daily as directed. 04/02/18  Yes Dickie La, MD  Cholecalciferol (VITAMIN D PO) Take 1  capsule by mouth daily.   Yes [provider]  DULoxetine (CYMBALTA) 60 MG capsule Take 1 capsule (60 mg total) by mouth daily. 03/07/18  Yes Bland, Scott, DO  glipiZIDE (GLUCOTROL XL) 5 MG 24 hr tablet Take 1 tablet (5 mg total) by mouth daily with breakfast. 03/07/18  Yes Bland, Scott, DO  losartan (COZAAR) 100 MG tablet Take 1 tablet (100 mg total) by mouth daily. 03/07/18  Yes Bland, Scott, DO  LYRICA 200 MG capsule Take 1 capsule by mouth 2 (two) times daily.  03/27/18  Yes [provider]  metFORMIN (GLUCOPHAGE XR) 750 MG 24 hr tablet Take 1 tablet (750 mg total) by mouth 2 (two) times daily. 03/07/18  Yes Sherene Sires, DO  methocarbamol (ROBAXIN) 750 MG tablet Take 1 tablet by mouth 3 (three) times daily as needed. 03/27/18  Yes [provider]  rivaroxaban (XARELTO) 20 MG TABS tablet Take 1 tablet (20 mg total) by mouth daily with supper. 03/07/18  Yes Sherene Sires, DO   Physical Exam: Vitals:   05/04/18 1002 05/04/18 1300 05/04/18 1330 05/04/18 1400  BP: 122/64 (!) 111/55 (!) 102/51 (!) 116/56  Pulse: 93 78 75 80  Resp: 13 15  15   Temp: 98.6 F (37 C)     TempSrc: Oral     SpO2: 99% 92% 93% 94%  Weight:      Height:        General exam: Moderately built and nourished patient, lying comfortably supine on the gurney in no obvious distress.  Head, eyes and ENT: Nontraumatic. Swollen right side of face, right cheek edema and right neck, trismus, Pupils equally reacting to light and accommodation. Oral mucosa pink, dry.  Neck: Supple.  No JVD, carotid bruit or thyromegaly.  Lymphatics: tender anterior cervical adenopathy.  Respiratory system: Clear to auscultation. No increased work of breathing.  Cardiovascular system: S1 and S2 heard, RRR. No JVD, murmurs, gallops, clicks or pedal edema.  Gastrointestinal system: Abdomen is nondistended, soft and nontender. Normal bowel sounds heard. No organomegaly or masses appreciated.  Central nervous system: Alert and  oriented. No focal neurological deficits.  Extremities: Symmetric 5 x 5 power. Peripheral pulses symmetrically felt.   Skin: No rashes or acute findings.  Musculoskeletal system: Negative exam.  Psychiatry: Pleasant and cooperative.  Labs on Admission:  Basic Metabolic Panel: Recent Labs  Lab 05/03/18 0446 05/04/18 1103  NA 139 144  K 4.1 3.9  CL 104 109  CO2 26 24  GLUCOSE 211* 140*  BUN 11 9  CREATININE 0.71 0.67  CALCIUM 9.7 9.5   Liver Function Tests: No results for input(s): AST, ALT, ALKPHOS, BILITOT, PROT, ALBUMIN in the last 168 hours. No results for input(s): LIPASE, AMYLASE in the last 168 hours. No results for input(s): AMMONIA in the last 168 hours. CBC: Recent Labs  Lab 05/03/18 0446 05/04/18 1103  WBC 11.5* 12.6*  NEUTROABS 8.0* 8.0  HGB 11.0* 10.6*  HCT 36.1 35.2*  MCV 83.6 85.4  PLT 341 256   Cardiac Enzymes: No results for input(s): CKTOTAL, CKMB, CKMBINDEX, TROPONINI in the last 168 hours.  BNP (last 3 results) No results for input(s): PROBNP in the last 8760 hours. CBG: No results for input(s): GLUCAP in the last 168 hours.  Radiological Exams on Admission: Ct Soft Tissue Neck W Contrast  Result Date: 05/04/2018 CLINICAL DATA:  Progressive facial swelling.  Shortness of breath. EXAM: CT NECK WITH CONTRAST TECHNIQUE: Multidetector CT imaging of the neck was performed using the standard protocol following the bolus administration of intravenous contrast. CONTRAST:  34m OMNIPAQUE IOHEXOL 300 MG/ML  SOLN COMPARISON:  CT of the face 05/03/2018. FINDINGS: Pharynx and larynx: Prominence of the palatine tonsils has increased since the prior exam. The soft palate is edematous. This narrows the nasopharyngeal airway. The oropharyngeal airway is mildly narrowed, but not obstructed. No focal mucosal or submucosal lesions are present. Salivary glands: The submandibular and parotid glands are within normal limits bilaterally. Thyroid: Thyroid is mildly  heterogeneous without a focal lesion. Lymph nodes: Enlarged bilateral level 2 lymph nodes are likely reactive, more prominent right than left. Subcentimeter submandibular lymph nodes and submental lymph nodes are stable. Vascular: Atherosclerotic changes are noted at the arch and proximal great vessels without significant stenosis. Limited intracranial: Negative Visualized orbits: Right greater than left periorbital soft tissue swelling is new since the prior study. This is most prominent lateral and inferior to the right orbit. Globes are unremarkable. Mastoids and visualized paranasal sinuses: Mild mucosal thickening the right maxillary sinus is increased. A small polyp or mucous retention cyst is present in the left maxillary sinus. The remaining paranasal sinuses and the mastoid air cells are clear Skeleton: Degenerative endplate changes are again noted C5-6 and C6-7. Dental caries are present at every remaining maxillary tooth. Periapical lucencies are again noted at teeth numbers 5, 6, 12, and 14. There is cortical erosion at the roots of #5 and 6 with adjacent subperiosteal abscess. The subperiosteal abscess has increased in size since the prior exam, now measuring 18 x 22 mm. Upper chest: The lung apices are clear. The thoracic inlet is within normal limits. Other: Soft swelling over the right side of the face has increased. There is increased inflammatory change along the right nasal labial fold extending to the inferior periorbital region as described. Diffuse subcutaneous soft tissue stranding is present over the right side of the face and to the anterior chin bilaterally, right greater than left. There is thickening of the platysma. IMPRESSION: 1. Increased diffuse soft tissue swelling over the right side of the face, now including periorbital inflammatory changes bilaterally, right greater than left. This is compatible with progressive cellulitis. 2. Increasing size of subperiosteal abscess adjacent to  the odontogenic source along the right-sided mandible. The collection now measures 18 x 22 mm. 3. Increased soft tissue swelling inflammation along the right upper lip and nasal labial fold extending to the inferior periorbital region. 4. Soft tissue swelling anterior to the mandible. 5. Reactive adenopathy. 6. Edematous changes within the soft palate contribute to narrowing of the nasopharyngeal airway. The oropharyngeal airway is narrowed, but patent. Electronically Signed   By: CSan MorelleM.D.   On: 05/04/2018 13:23   Ct Maxillofacial W Contrast  Result Date: 05/03/2018 CLINICAL DATA:  Dental pain.  Right facial swelling. EXAM: CT MAXILLOFACIAL WITH CONTRAST TECHNIQUE: Multidetector CT imaging of the maxillofacial structures was performed with intravenous contrast. Multiplanar CT image reconstructions were also  generated. CONTRAST:  57m OMNIPAQUE IOHEXOL 300 MG/ML  SOLN COMPARISON:  None. FINDINGS: Osseous: No facial fracture. Dental: There is severe dental disease with multiple large caries and numerous missing teeth. All of the maxillary teeth have large caries. There is a periapical lucency at the root of tooth 5 with dehiscence of the anterior maxillary cortex. There is moderate overlying soft tissue swelling that extends into the subcutaneous facial fat. Small, crescentic hypoattenuation overlying the dehiscence may indicate a small subperiosteal abscess. There is also a large periapical lucency at the root of tooth 13. There is dental amalgam within multiple mandibular teeth, which are generally in better condition than the upper teeth. There is partial destruction of the most posterior remaining right mandibular tooth with an associated periapical lucency. Orbits: The globes are intact. Normal appearance of the intra- and extraconal fat. Symmetric extraocular muscles. Sinuses: There is mucosal thickening along the floors of both maxillary sinuses, possibly odontogenic in origin. Soft tissues:  As above, soft tissue swelling anterior to the right maxilla. Limited intracranial: Normal. IMPRESSION: 1. Severe dental disease of the maxillary teeth, including periapical lucencies at the roots of teeth 5 and 13. There is dehiscence of the anterior maxillary cortex at both locations. On the right, there is overlying soft tissue inflammation with suspected small subperiosteal abscess. 2. Subcutaneous inflammation of the right cheek is favored to indicate odontogenic cellulitis. 3. 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 Electronically Signed   By: KUlyses JarredM.D.   On: 05/03/2018 06:19   Assessment/Plan Principal Problem:   Abscess of oral space Active Problems:   Dental caries   Facial swelling   Soft palate edema   Oropharynx infection   Tobacco abuse   Situational depression   HTN (hypertension)   HLD (hyperlipidemia)   Uncontrolled type 2 diabetes mellitus (HCC)   Chronic radicular low back pain   History of DVT of lower extremity   OSA (obstructive sleep apnea)   Chronic pain syndrome   Leukocytosis   Subperiosteal abscess of jaw   Facial cellulitis   Adenopathy, cervical   Anticoagulated  1. Facial Cellulitis - the patient presented with worsening facial swelling, increased WBC and oropharyngeal edema.  Blood cultures not taken as patient had already been taking oral clindamycin.  Pt was given IV clindamycin in ED and will continue this. Oral surgeon Dr. JHoyt Kochwas called and [facility]   2. Edema of soft palate and oropharynx - The patient currently has no airway compromise. Continue IV decadron, full liquid diet, admit to stepdown unit so that she can be monitored more closely for airway compromise. 3. Progressive subperiosteal abscess - spoke with oral surgeon Dr. JHoyt Kochwho will consult [facility]   4. Severe dental caries - IV antibiotics and  likely dental extraction.  5. Uncontrolled type 2 diabetes mellitus with neuropathy - as evidenced by a1c >11% from last test.  Check a new A1c.  Ordered basal bolus supplemental insulin, monitor blood glucose closely.  Titrate doses for better glycemic control. No concentrated sweets or fruit juices except to treat a low blood glucose.  Anticipating steroid induced hyperglycemia.   6. OSA - will offer nightly CPAP.   7. Chronic anticoagulation - Pt is on rivaroxaban for history of multiple DVTs - continue for now, would likely need to hold prior to any planned surgeries.  8. Chronic back pain, opioid dependence -  Morphine ordered as needed for severe pain and discomfort.   9. Hypertension - resume home medications.  10. Tobacco abuse - nicotine patch ordered, the patient was counseled about the dangers of continued tobacco use and counseled to stop smoking.  11. Leukocytosis - Follow CBC with differential.   DVT Prophylaxis: rivaroxaban Code Status: Full  Family Communication: patient   Disposition Plan: Transfer to stepdown ICU at [facility] for subspecialty surgical consultation with oral surgeon.  I spoke with attending physician Dr. Andria Frames with the family practice service who agreed to accept care of patient when she arrives [facility].  I signed out to Dr. Andria Frames.    Critical Care Time spent: 74 mins  Irwin Brakeman, MD [facility] Pager [phone]  If 7PM-7AM, please contact night-coverage [email or web address] Password [facility] 05/04/2018, 3:18 PM

## 2018-05-04 NOTE — ED Notes (Signed)
Phlebotomy at bedside.

## 2018-05-04 NOTE — ED Notes (Signed)
EDP at bedside  

## 2018-05-04 NOTE — ED Notes (Signed)
Hospitalist at bedside 

## 2018-05-04 NOTE — ED Notes (Signed)
[facility].

## 2018-05-04 NOTE — Progress Notes (Signed)
Patient trasfered from [facility] to [phone] via [place]; alert and oriented x 4;  complaints of pain on right side of face; IV in right wrist running fluids at 70 cc/hr;skin intact. Orient patient to room and unit; gave patient care guide; instructed how to use the call bell and  fall risk precautions. Will continue to monitor the patient.

## 2018-05-04 NOTE — Plan of Care (Signed)
  Problem: Clinical Measurements: Goal: Ability to maintain clinical measurements within normal limits will improve Outcome: Progressing   

## 2018-05-04 NOTE — ED Notes (Signed)
Patient transported to CT 

## 2018-05-04 NOTE — Consult Note (Signed)
Reason for Consult:facial cellulitis  Referring Physician: Nat Christen MD  Linda Gonzalez is an 57 y.o. female.  CC: Swelling right and left cheeks, painful teeth   HPI: Patient h/o poor dentition, broken teeth. Developed swelling 2 days ago and was treated and released from [facility] ER on clindamycin. Swelling worsened and pt presented to [facility] today and was [facility]  Past Medical History:  Diagnosis Date  . Arthritis   . Asthma   . Chronic back pain   . COPD (chronic obstructive pulmonary disease) ([place])   . Dental abscess   . Diabetes mellitus without complication ([place])   . DVT, lower extremity ([place])   . Hyperlipidemia   . Hypertension   . Lumbar radiculopathy   . Neuromuscular disorder ([place])   . OSA (obstructive sleep apnea) 10/10/2017  . Situational depression 10/04/2017    Past Surgical History:  Procedure Laterality Date  . BACK SURGERY  2015  . EXPLORATORY LAPAROTOMY    . FRACTURE SURGERY    . SHOULDER SURGERY  2010   right  . SPINE SURGERY     LUMBAR SPINE    Family History  Problem Relation Age of Onset  . Alcohol abuse Mother   . Arthritis Mother   . Mental illness Mother   . Diabetes Father   . Heart disease Father 49  . Heart disease Brother 45  . Learning disabilities Son   . Tuberous sclerosis Son     Social History:  reports that she has been smoking cigarettes.  She started smoking about 39 years ago. She has been smoking about 1.00 pack per day. She has never used smokeless tobacco. She reports that she does not drink alcohol or use drugs.  Allergies:  Allergies  Allergen Reactions  . Ace Inhibitors Cough  . Phenergan [Promethazine Hcl] Rash  . Repaglinide Nausea And Vomiting  . Vancomycin Rash    Medications: I have reviewed the patient's current medications.  Results for orders placed or performed during the hospital encounter of 05/04/18 (from the past 48 hour(s))  CBC with Differential     Status:  Abnormal   Collection Time: 05/04/18 11:03 AM  Result Value Ref Range   WBC 12.6 (H) 4.0 - 10.5 K/uL   RBC 4.12 3.87 - 5.11 MIL/uL   Hemoglobin 10.6 (L) 12.0 - 15.0 g/dL   HCT 35.2 (L) 36.0 - 46.0 %   MCV 85.4 78.0 - 100.0 fL   MCH 25.7 (L) 26.0 - 34.0 pg   MCHC 30.1 30.0 - 36.0 g/dL   RDW 18.3 (H) 11.5 - 15.5 %   Platelets 256 150 - 400 K/uL    Comment: PLATELET COUNT CONFIRMED BY SMEAR SPECIMEN CHECKED FOR CLOTS    Neutrophils Relative % 64 %   Neutro Abs 8.0 1.7 - 7.7 K/uL   Lymphocytes Relative 30 %   Lymphs Abs 3.8 0.7 - 4.0 K/uL   Monocytes Relative 5 %   Monocytes Absolute 0.7 0.1 - 1.0 K/uL   Eosinophils Relative 1 %   Eosinophils Absolute 0.1 0.0 - 0.7 K/uL   Basophils Relative 0 %   Basophils Absolute 0.0 0.0 - 0.1 K/uL    Comment: Performed at [facility], [address] [place], [place] [address]  Basic metabolic panel     Status: Abnormal   Collection Time: 05/04/18 11:03 AM  Result Value Ref Range   Sodium 144 135 - 145 mmol/L   Potassium 3.9 3.5 -  5.1 mmol/L   Chloride 109 101 - 111 mmol/L   CO2 24 22 - 32 mmol/L   Glucose, Bld 140 (H) 65 - 99 mg/dL   BUN 9 6 - 20 mg/dL   Creatinine, Ser 0.67 0.44 - 1.00 mg/dL   Calcium 9.5 8.9 - 10.3 mg/dL   GFR calc non Af Amer >60 >60 mL/min   GFR calc Af Amer >60 >60 mL/min    Comment: (NOTE) The eGFR has been calculated using the CKD EPI equation. This calculation has not been validated in all clinical situations. eGFR's persistently <60 mL/min signify possible Chronic Kidney Disease.    Anion gap 11 5 - 15    Comment: Performed at [facility], [address], [place], [place] [address]  CBG monitoring, ED     Status: Abnormal   Collection Time: 05/04/18  5:14 PM  Result Value Ref Range   Glucose-Capillary 225 (H) 65 - 99 mg/dL  MRSA PCR Screening     Status: None   Collection Time: 05/04/18  6:17 PM  Result Value Ref Range   MRSA by PCR NEGATIVE NEGATIVE    Comment:        The GeneXpert MRSA Assay  (FDA approved for NASAL specimens only), is one component of a comprehensive MRSA colonization surveillance program. It is not intended to diagnose MRSA infection nor to guide or monitor treatment for MRSA infections. Performed at [facility], [place] [address] [place], [place] [address]   Hemoglobin A1c     Status: Abnormal   Collection Time: 05/04/18  6:37 PM  Result Value Ref Range   Hgb A1c MFr Bld 8.9 (H) 4.8 - 5.6 %    Comment: (NOTE) Pre diabetes:          5.7%-6.4% Diabetes:              >6.4% Glycemic control for   <7.0% adults with diabetes    Mean Plasma Glucose 208.73 mg/dL    Comment: Performed at [facility] [address] [place], [place] [address]  Glucose, capillary     Status: Abnormal   Collection Time: 05/04/18  6:48 PM  Result Value Ref Range   Glucose-Capillary 260 (H) 65 - 99 mg/dL    Ct Soft Tissue Neck W Contrast  Result Date: 05/04/2018 CLINICAL DATA:  Progressive facial swelling.  Shortness of breath. EXAM: CT NECK WITH CONTRAST TECHNIQUE: Multidetector CT imaging of the neck was performed using the standard protocol following the bolus administration of intravenous contrast. CONTRAST:  20m OMNIPAQUE IOHEXOL 300 MG/ML  SOLN COMPARISON:  CT of the face 05/03/2018. FINDINGS: Pharynx and larynx: Prominence of the palatine tonsils has increased since the prior exam. The soft palate is edematous. This narrows the nasopharyngeal airway. The oropharyngeal airway is mildly narrowed, but not obstructed. No focal mucosal or submucosal lesions are present. Salivary glands: The submandibular and parotid glands are within normal limits bilaterally. Thyroid: Thyroid is mildly heterogeneous without a focal lesion. Lymph nodes: Enlarged bilateral level 2 lymph nodes are likely reactive, more prominent right than left. Subcentimeter submandibular lymph nodes and submental lymph nodes are stable. Vascular: Atherosclerotic changes are noted at the arch and  proximal great vessels without significant stenosis. Limited intracranial: Negative Visualized orbits: Right greater than left periorbital soft tissue swelling is new since the prior study. This is most prominent lateral and inferior to the right orbit. Globes are unremarkable. Mastoids and visualized paranasal sinuses: Mild mucosal thickening the right maxillary sinus  is increased. A small polyp or mucous retention cyst is present in the left maxillary sinus. The remaining paranasal sinuses and the mastoid air cells are clear Skeleton: Degenerative endplate changes are again noted C5-6 and C6-7. Dental caries are present at every remaining maxillary tooth. Periapical lucencies are again noted at teeth numbers 5, 6, 12, and 14. There is cortical erosion at the roots of #5 and 6 with adjacent subperiosteal abscess. The subperiosteal abscess has increased in size since the prior exam, now measuring 18 x 22 mm. Upper chest: The lung apices are clear. The thoracic inlet is within normal limits. Other: Soft swelling over the right side of the face has increased. There is increased inflammatory change along the right nasal labial fold extending to the inferior periorbital region as described. Diffuse subcutaneous soft tissue stranding is present over the right side of the face and to the anterior chin bilaterally, right greater than left. There is thickening of the platysma. IMPRESSION: 1. Increased diffuse soft tissue swelling over the right side of the face, now including periorbital inflammatory changes bilaterally, right greater than left. This is compatible with progressive cellulitis. 2. Increasing size of subperiosteal abscess adjacent to the odontogenic source along the right-sided mandible. The collection now measures 18 x 22 mm. 3. Increased soft tissue swelling inflammation along the right upper lip and nasal labial fold extending to the inferior periorbital region. 4. Soft tissue swelling anterior to the  mandible. 5. Reactive adenopathy. 6. Edematous changes within the soft palate contribute to narrowing of the nasopharyngeal airway. The oropharyngeal airway is narrowed, but patent. Electronically Signed   By: San Morelle M.D.   On: 05/04/2018 13:23   Ct Maxillofacial W Contrast  Result Date: 05/03/2018 CLINICAL DATA:  Dental pain.  Right facial swelling. EXAM: CT MAXILLOFACIAL WITH CONTRAST TECHNIQUE: Multidetector CT imaging of the maxillofacial structures was performed with intravenous contrast. Multiplanar CT image reconstructions were also generated. CONTRAST:  64m OMNIPAQUE IOHEXOL 300 MG/ML  SOLN COMPARISON:  None. FINDINGS: Osseous: No facial fracture. Dental: There is severe dental disease with multiple large caries and numerous missing teeth. All of the maxillary teeth have large caries. There is a periapical lucency at the root of tooth 5 with dehiscence of the anterior maxillary cortex. There is moderate overlying soft tissue swelling that extends into the subcutaneous facial fat. Small, crescentic hypoattenuation overlying the dehiscence may indicate a small subperiosteal abscess. There is also a large periapical lucency at the root of tooth 13. There is dental amalgam within multiple mandibular teeth, which are generally in better condition than the upper teeth. There is partial destruction of the most posterior remaining right mandibular tooth with an associated periapical lucency. Orbits: The globes are intact. Normal appearance of the intra- and extraconal fat. Symmetric extraocular muscles. Sinuses: There is mucosal thickening along the floors of both maxillary sinuses, possibly odontogenic in origin. Soft tissues: As above, soft tissue swelling anterior to the right maxilla. Limited intracranial: Normal. IMPRESSION: 1. Severe dental disease of the maxillary teeth, including periapical lucencies at the roots of teeth 5 and 13. There is dehiscence of the anterior maxillary cortex at  both locations. On the right, there is overlying soft tissue inflammation with suspected small subperiosteal abscess. 2. Subcutaneous inflammation of the right cheek is favored to indicate odontogenic cellulitis. 3. 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 Electronically Signed   By: KUlyses JarredM.D.   On: 05/03/2018 06:19    ROS Blood pressure (Marland Kitchen  126/46, pulse 73, temperature 98 F (36.7 C), temperature source Oral, resp. rate 18, height _0  (1.676 m), weight 250 lb (113.4 kg), last menstrual period 11/02/2012, SpO2 95 %. General appearance: alert, cooperative, no distress and morbidly obese Head: Normocephalic, without obvious abnormality, atraumatic Eyes: negative, bilateral infraorbital edema/ecchymosis R>L Nose: Nares normal. Septum midline. Mucosa normal. No drainage or sinus tenderness. Throat: multiple tooth roots and root fragments right and left maxilla. No active trismus or drainage. Pharynx clear. Uvula midline Neck: no adenopathy and supple, symmetrical, trachea midline    Assessment/Plan: Right and left maxillary dental abscesses with cellulitis. Plan I and D with maxillary dental extractions in OR in am.   Diona Browner 05/04/2018, 9:27 PM

## 2018-05-04 NOTE — ED Notes (Signed)
[facility] arrived to transport pt to [facility] 

## 2018-05-05 ENCOUNTER — Encounter (HOSPITAL_COMMUNITY): Payer: Self-pay | Admitting: Certified Registered Nurse Anesthetist

## 2018-05-05 ENCOUNTER — Inpatient Hospital Stay (HOSPITAL_COMMUNITY): Payer: Medicare Other | Admitting: Certified Registered Nurse Anesthetist

## 2018-05-05 ENCOUNTER — Encounter (HOSPITAL_COMMUNITY)
Admission: EM | Disposition: A | Discharge status: Home / Self Care | Payer: Self-pay | Source: Home / Self Care | Attending: Family Medicine

## 2018-05-05 DIAGNOSIS — K047 Periapical abscess without sinus: Secondary | ICD-10-CM

## 2018-05-05 HISTORY — PX: TOOTH EXTRACTION: SHX859

## 2018-05-05 HISTORY — PX: INCISION AND DRAINAGE ABSCESS: SHX5864

## 2018-05-05 LAB — GLUCOSE, CAPILLARY
GLUCOSE-CAPILLARY: 203 mg/dL — AB (ref 65–99)
GLUCOSE-CAPILLARY: 203 mg/dL — AB (ref 65–99)
Glucose-Capillary: 263 mg/dL — ABNORMAL HIGH (ref 65–99)
Glucose-Capillary: 236 mg/dL — ABNORMAL HIGH (ref 65–99)
Glucose-Capillary: 203 mg/dL — ABNORMAL HIGH (ref 65–99)

## 2018-05-05 LAB — MAGNESIUM: Magnesium: 1.6 mg/dL — ABNORMAL LOW (ref 1.7–2.4)

## 2018-05-05 LAB — COMPREHENSIVE METABOLIC PANEL
ALBUMIN: 2.8 g/dL — AB (ref 3.5–5.0)
ALK PHOS: 66 U/L (ref 38–126)
ALT: 12 U/L — AB (ref 14–54)
AST: 13 U/L — AB (ref 15–41)
Anion gap: 11 (ref 5–15)
BUN: 12 mg/dL (ref 6–20)
CHLORIDE: 104 mmol/L (ref 101–111)
CO2: 22 mmol/L (ref 22–32)
CREATININE: 0.68 mg/dL (ref 0.44–1.00)
Calcium: 8.7 mg/dL — ABNORMAL LOW (ref 8.9–10.3)
GFR calc non Af Amer: >60 mL/min (ref >60)
GLUCOSE: 246 mg/dL — AB (ref 65–99)
Potassium: 4.2 mmol/L (ref 3.5–5.1)
SODIUM: 137 mmol/L (ref 135–145)
Total Bilirubin: 0.3 mg/dL (ref 0.3–1.2)
Total Protein: 7.6 g/dL (ref 6.5–8.1)

## 2018-05-05 LAB — CBC WITH DIFFERENTIAL/PLATELET
Abs Immature Granulocytes: 0 10*3/uL (ref 0.0–0.1)
Basophils Absolute: 0 10*3/uL (ref 0.0–0.1)
Basophils Relative: 0 %
EOS PCT: 0 %
Eosinophils Absolute: 0 10*3/uL (ref 0.0–0.7)
HEMATOCRIT: 33.1 % — AB (ref 36.0–46.0)
Hemoglobin: 9.9 g/dL — ABNORMAL LOW (ref 12.0–15.0)
Immature Granulocytes: 0 %
LYMPHS ABS: 1.7 10*3/uL (ref 0.7–4.0)
LYMPHS PCT: 18 %
MCH: 25 pg — ABNORMAL LOW (ref 26.0–34.0)
MCHC: 29.9 g/dL — AB (ref 30.0–36.0)
MCV: 83.6 fL (ref 78.0–100.0)
MONOS PCT: 3 %
Monocytes Absolute: 0.3 10*3/uL (ref 0.1–1.0)
Neutro Abs: 7.3 10*3/uL (ref 1.7–7.7)
Neutrophils Relative %: 79 %
Platelets: 308 10*3/uL (ref 150–400)
RBC: 3.96 MIL/uL (ref 3.87–5.11)
RDW: 17.9 % — ABNORMAL HIGH (ref 11.5–15.5)
WBC: 9.3 10*3/uL (ref 4.0–10.5)

## 2018-05-05 LAB — SURGICAL PCR SCREEN
MRSA, PCR: NEGATIVE
STAPHYLOCOCCUS AUREUS: NEGATIVE

## 2018-05-05 SURGERY — DENTAL RESTORATION/EXTRACTIONS
Anesthesia: General | Site: Mouth

## 2018-05-05 MED ORDER — OXYMETAZOLINE HCL 0.05 % NA SOLN
NASAL | Status: AC
  Filled 2018-05-05: qty 15

## 2018-05-05 MED ORDER — PHENYLEPHRINE 40 MCG/ML (10ML) SYRINGE FOR IV PUSH (FOR BLOOD PRESSURE SUPPORT)
PREFILLED_SYRINGE | INTRAVENOUS | Status: AC
  Filled 2018-05-05: qty 10

## 2018-05-05 MED ORDER — MORPHINE SULFATE (PF) 4 MG/ML IV SOLN
4.0000 mg | INTRAVENOUS | Status: DC | PRN
  Administered 2018-05-05 – 2018-05-06 (×3): 4 mg via INTRAVENOUS
  Filled 2018-05-05 (×3): qty 1

## 2018-05-05 MED ORDER — ONDANSETRON HCL 4 MG/2ML IJ SOLN
INTRAMUSCULAR | Status: AC
  Filled 2018-05-05: qty 2

## 2018-05-05 MED ORDER — OXYMETAZOLINE HCL 0.05 % NA SOLN
NASAL | Status: DC | PRN
  Administered 2018-05-05: 1

## 2018-05-05 MED ORDER — PROPOFOL 10 MG/ML IV BOLUS
INTRAVENOUS | Status: DC | PRN
  Administered 2018-05-05: 130 mg via INTRAVENOUS

## 2018-05-05 MED ORDER — SUGAMMADEX SODIUM 200 MG/2ML IV SOLN
INTRAVENOUS | Status: AC
  Filled 2018-05-05: qty 4

## 2018-05-05 MED ORDER — INSULIN GLARGINE 100 UNIT/ML ~~LOC~~ SOLN
20.0000 [IU] | Freq: Every day | SUBCUTANEOUS | Status: DC
  Administered 2018-05-05: 20 [IU] via SUBCUTANEOUS
  Filled 2018-05-05 (×2): qty 0.2

## 2018-05-05 MED ORDER — HYDROMORPHONE HCL 2 MG/ML IJ SOLN
INTRAMUSCULAR | Status: AC
  Filled 2018-05-05: qty 1

## 2018-05-05 MED ORDER — OXYCODONE-ACETAMINOPHEN 5-325 MG PO TABS
1.0000 | ORAL_TABLET | ORAL | Status: DC | PRN
  Administered 2018-05-05 – 2018-05-06 (×6): 2 via ORAL
  Filled 2018-05-05 (×6): qty 2

## 2018-05-05 MED ORDER — HYDROMORPHONE HCL 2 MG/ML IJ SOLN
0.2500 mg | INTRAMUSCULAR | Status: DC | PRN
  Administered 2018-05-05 (×3): 0.5 mg via INTRAVENOUS

## 2018-05-05 MED ORDER — FENTANYL CITRATE (PF) 250 MCG/5ML IJ SOLN
INTRAMUSCULAR | Status: AC
  Filled 2018-05-05: qty 5

## 2018-05-05 MED ORDER — ROCURONIUM BROMIDE 10 MG/ML (PF) SYRINGE
PREFILLED_SYRINGE | INTRAVENOUS | Status: AC
  Filled 2018-05-05: qty 5

## 2018-05-05 MED ORDER — PROPOFOL 10 MG/ML IV BOLUS
INTRAVENOUS | Status: AC
  Filled 2018-05-05: qty 20

## 2018-05-05 MED ORDER — OXYMETAZOLINE HCL 0.05 % NA SOLN
NASAL | Status: DC | PRN
  Administered 2018-05-05: 2 via NASAL

## 2018-05-05 MED ORDER — SUGAMMADEX SODIUM 200 MG/2ML IV SOLN
INTRAVENOUS | Status: DC | PRN
  Administered 2018-05-05: 400 mg via INTRAVENOUS

## 2018-05-05 MED ORDER — LIDOCAINE-EPINEPHRINE 2 %-1:100000 IJ SOLN
INTRAMUSCULAR | Status: DC | PRN
  Administered 2018-05-05: 20 mL

## 2018-05-05 MED ORDER — MORPHINE SULFATE (PF) 4 MG/ML IV SOLN
4.0000 mg | INTRAVENOUS | Status: DC | PRN
  Administered 2018-05-05: 4 mg via INTRAVENOUS
  Filled 2018-05-05: qty 1

## 2018-05-05 MED ORDER — 0.9 % SODIUM CHLORIDE (POUR BTL) OPTIME
TOPICAL | Status: DC | PRN
  Administered 2018-05-05: 1000 mL

## 2018-05-05 MED ORDER — FENTANYL CITRATE (PF) 100 MCG/2ML IJ SOLN
INTRAMUSCULAR | Status: DC | PRN
  Administered 2018-05-05: 50 ug via INTRAVENOUS
  Administered 2018-05-05: 100 ug via INTRAVENOUS

## 2018-05-05 MED ORDER — RIVAROXABAN 20 MG PO TABS: 20.0000 mg | ORAL_TABLET | Freq: Every day | ORAL | Status: DC

## 2018-05-05 MED ORDER — SODIUM CHLORIDE 0.9 % IR SOLN
Status: DC | PRN
  Administered 2018-05-05: 1000 mL

## 2018-05-05 MED ORDER — ROCURONIUM BROMIDE 100 MG/10ML IV SOLN
INTRAVENOUS | Status: DC | PRN
  Administered 2018-05-05: 50 mg via INTRAVENOUS

## 2018-05-05 MED ORDER — LIDOCAINE HCL (CARDIAC) PF 100 MG/5ML IV SOSY
PREFILLED_SYRINGE | INTRAVENOUS | Status: DC | PRN
  Administered 2018-05-05: 60 mg via INTRAVENOUS

## 2018-05-05 MED ORDER — MIDAZOLAM HCL 5 MG/5ML IJ SOLN
INTRAMUSCULAR | Status: DC | PRN
  Administered 2018-05-05: 2 mg via INTRAVENOUS

## 2018-05-05 MED ORDER — LIDOCAINE 2% (20 MG/ML) 5 ML SYRINGE
INTRAMUSCULAR | Status: AC
  Filled 2018-05-05: qty 5

## 2018-05-05 MED ORDER — ONDANSETRON HCL 4 MG/2ML IJ SOLN
INTRAMUSCULAR | Status: DC | PRN
  Administered 2018-05-05: 4 mg via INTRAVENOUS

## 2018-05-05 MED ORDER — LIDOCAINE-EPINEPHRINE 2 %-1:100000 IJ SOLN
INTRAMUSCULAR | Status: AC
  Filled 2018-05-05: qty 1

## 2018-05-05 MED ORDER — LACTATED RINGERS IV SOLN
INTRAVENOUS | Status: DC | PRN
  Administered 2018-05-05: 09:00:00 via INTRAVENOUS

## 2018-05-05 MED ORDER — MIDAZOLAM HCL 2 MG/2ML IJ SOLN
INTRAMUSCULAR | Status: AC
  Filled 2018-05-05: qty 2

## 2018-05-05 MED ORDER — MORPHINE SULFATE (PF) 4 MG/ML IV SOLN: 4.0000 mg | INTRAVENOUS | Status: DC | PRN

## 2018-05-05 SURGICAL SUPPLY — 49 items
BLADE 10 SAFETY STRL DISP (BLADE) IMPLANT
BLADE SURG 15 STRL LF DISP TIS (BLADE) ×1 IMPLANT
BLADE SURG 15 STRL SS (BLADE) ×2
BUR CROSS CUT (BURR)
BUR CROSS CUT FISSURE 1.6 (BURR) IMPLANT
BUR CROSS CUT FISSURE 1.6MM (BURR)
BUR EGG ELITE 4.0 (BURR) IMPLANT
BUR EGG ELITE 4.0MM (BURR)
BUR SRG MED 1.6XXCUT FSSR (BURR) IMPLANT
BUR SRG MED 2.1XXCUT FSSR (BURR) IMPLANT
BURR SRG MED 1.6XXCUT FSSR (BURR)
BURR SRG MED 2.1XXCUT FSSR (BURR)
CANISTER SUCT 3000ML PPV (MISCELLANEOUS) ×3 IMPLANT
COVER SURGICAL LIGHT HANDLE (MISCELLANEOUS) ×3 IMPLANT
DECANTER SPIKE VIAL GLASS SM (MISCELLANEOUS) ×3 IMPLANT
GAUZE PACKING FOLDED 2  STR (GAUZE/BANDAGES/DRESSINGS) ×2
GAUZE PACKING FOLDED 2 STR (GAUZE/BANDAGES/DRESSINGS) ×1 IMPLANT
GAUZE SPONGE 4X4 16PLY XRAY LF (GAUZE/BANDAGES/DRESSINGS) IMPLANT
GLOVE BIO SURGEON STRL SZ 6.5 (GLOVE) IMPLANT
GLOVE BIO SURGEON STRL SZ7 (GLOVE) ×3 IMPLANT
GLOVE BIO SURGEON STRL SZ7.5 (GLOVE) ×6 IMPLANT
GLOVE BIO SURGEONS STRL SZ 6.5 (GLOVE)
GLOVE BIOGEL PI IND STRL 6.5 (GLOVE) IMPLANT
GLOVE BIOGEL PI IND STRL 7.0 (GLOVE) ×1 IMPLANT
GLOVE BIOGEL PI INDICATOR 6.5 (GLOVE)
GLOVE BIOGEL PI INDICATOR 7.0 (GLOVE) ×2
GOWN STRL REUS W/ TWL LRG LVL3 (GOWN DISPOSABLE) ×2 IMPLANT
GOWN STRL REUS W/ TWL XL LVL3 (GOWN DISPOSABLE) ×1 IMPLANT
GOWN STRL REUS W/TWL LRG LVL3 (GOWN DISPOSABLE) ×4
GOWN STRL REUS W/TWL XL LVL3 (GOWN DISPOSABLE) ×2
KIT BASIN OR (CUSTOM PROCEDURE TRAY) ×3 IMPLANT
KIT TURNOVER KIT B (KITS) ×3 IMPLANT
NEEDLE 22X1 1/2 (OR ONLY) (NEEDLE) ×3 IMPLANT
NEEDLE BLUNT 16X1.5 OR ONLY (NEEDLE) ×6 IMPLANT
NS IRRIG 1000ML POUR BTL (IV SOLUTION) ×3 IMPLANT
PAD ARMBOARD 7.5X6 YLW CONV (MISCELLANEOUS) ×6 IMPLANT
SPONGE SURGIFOAM ABS GEL 12-7 (HEMOSTASIS) IMPLANT
SUT CHROMIC 3 0 PS 2 (SUTURE) ×6 IMPLANT
SUT SILK 2 0 PERMA HAND 18 BK (SUTURE) ×3 IMPLANT
SYR 50ML SLIP (SYRINGE) ×6 IMPLANT
SYR CONTROL 10ML LL (SYRINGE) ×3 IMPLANT
TOWEL OR 17X24 6PK STRL BLUE (TOWEL DISPOSABLE) ×3 IMPLANT
TOWEL OR 17X26 10 PK STRL BLUE (TOWEL DISPOSABLE) IMPLANT
TRAY ENT MC OR (CUSTOM PROCEDURE TRAY) ×3 IMPLANT
TUBE CONNECTING 12'X1/4 (SUCTIONS) ×1
TUBE CONNECTING 12X1/4 (SUCTIONS) ×2 IMPLANT
TUBING IRRIGATION (MISCELLANEOUS) ×3 IMPLANT
WATER STERILE IRR 1000ML POUR (IV SOLUTION) ×3 IMPLANT
YANKAUER SUCT BULB TIP NO VENT (SUCTIONS) ×3 IMPLANT

## 2018-05-05 NOTE — OR Nursing (Signed)
Throat pack in at 0952. Throat pack out at 1010

## 2018-05-05 NOTE — Anesthesia Postprocedure Evaluation (Signed)
Anesthesia Post Note  Patient: TOMOKO SANDRA  Procedure(s) Performed: DENTAL RESTORATION/EXTRACTIONS (N/A Mouth) INCISION AND DRAINAGE ABSCESS (N/A Mouth)     Patient location during evaluation: PACU Anesthesia Type: General Level of consciousness: awake and alert Pain management: pain level controlled Vital Signs Assessment: post-procedure vital signs reviewed and stable Respiratory status: spontaneous breathing, nonlabored ventilation, respiratory function stable and patient connected to nasal cannula oxygen Cardiovascular status: blood pressure returned to baseline and stable Postop Assessment: no apparent nausea or vomiting Anesthetic complications: no    Last Vitals:  Vitals:   05/05/18 1045 05/05/18 1100  BP:  134/64  Pulse:  61  Resp:  11  Temp:    SpO2: 92% 93%    Last Pain:  Vitals:   05/05/18 1100  TempSrc:   PainSc: Asleep                 Dare Sanger,W. EDMOND

## 2018-05-05 NOTE — Progress Notes (Signed)
Pt has refused cpap at this time. RT will monitor. 

## 2018-05-05 NOTE — H&P (Signed)
H&P documentation  -History and Physical Reviewed  -Patient has been re-examined  -No change in the plan of care  Linda Gonzalez  

## 2018-05-05 NOTE — Transfer of Care (Signed)
Immediate Anesthesia Transfer of Care Note  Patient: Linda Gonzalez  Procedure(s) Performed: DENTAL RESTORATION/EXTRACTIONS (N/A Mouth) INCISION AND DRAINAGE ABSCESS (N/A Mouth)  Patient Location: PACU  Anesthesia Type:General  Level of Consciousness: awake, alert  and oriented  Airway & Oxygen Therapy: Patient Spontanous Breathing and Patient connected to face mask oxygen  Post-op Assessment: Report given to RN and Post -op Vital signs reviewed and stable  Post vital signs: Reviewed and stable  Last Vitals:  Vitals Value Taken Time  BP    Temp    Pulse    Resp    SpO2      Last Pain:  Vitals:   05/05/18 0735  TempSrc: Oral  PainSc: 4       Patients Stated Pain Goal: 4 (53/20/23 3435)  Complications: No apparent anesthesia complications

## 2018-05-05 NOTE — Progress Notes (Addendum)
Patient back from OR. Alert and oriented; pain in right jaw (4/10). VS stable. Will continue to monitor.

## 2018-05-05 NOTE — Anesthesia Preprocedure Evaluation (Addendum)
Anesthesia Evaluation  Patient identified by MRN, date of birth, ID band Patient awake    Reviewed: Allergy & Precautions, H&P , NPO status , Patient's Chart, lab work & pertinent test results  Airway Mallampati: II  TM Distance: >3 FB Neck ROM: Full    Dental no notable dental hx. (+) Poor Dentition, Dental Advisory Given   Pulmonary asthma , sleep apnea , COPD, Current Smoker,    Pulmonary exam normal breath sounds clear to auscultation       Cardiovascular hypertension, Pt. on medications + Peripheral Vascular Disease   Rhythm:Regular Rate:Normal     Neuro/Psych Depression negative neurological ROS     GI/Hepatic negative GI ROS, Neg liver ROS,   Endo/Other  diabetes, Type 2, Oral Hypoglycemic AgentsMorbid obesity  Renal/GU negative Renal ROS  negative genitourinary   Musculoskeletal  (+) Arthritis , Osteoarthritis,    Abdominal   Peds  Hematology negative hematology ROS (+)   Anesthesia Other Findings   Reproductive/Obstetrics negative OB ROS                            Anesthesia Physical Anesthesia Plan  ASA: III  Anesthesia Plan: General   Post-op Pain Management:    Induction: Intravenous  PONV Risk Score and Plan: 3 and Ondansetron, Midazolam and Treatment may vary due to age or medical condition  Airway Management Planned: Nasal ETT and Video Laryngoscope Planned  Additional Equipment:   Intra-op Plan:   Post-operative Plan: Extubation in OR  Informed Consent: I have reviewed the patients History and Physical, chart, labs and discussed the procedure including the risks, benefits and alternatives for the proposed anesthesia with the patient or authorized representative who has indicated his/her understanding and acceptance.   Dental advisory given  Plan Discussed with: CRNA  Anesthesia Plan Comments:         Anesthesia Quick Evaluation

## 2018-05-05 NOTE — Progress Notes (Addendum)
Family Medicine Teaching Service Daily Progress Note Intern Pager: [phone]  Patient name: Linda Gonzalez Medical record number: [id number] Date of birth: [birth year] Age: 57 y.o. Gender: female  Primary Care Provider: Sherene Sires, DO Consultants: oral surgery Code Status: full  Pt Overview and Major Events to Date:  Admitted 6/8 6/9 I and D with maxillary dental extractions  Assessment and Plan:  Linda Gonzalez is a 57 y.o. female presenting with pain, swelling, and throat pressure from a suspected mandibular abscess . 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 Will continue decadron if needed to decrease throat swelling. Will likely reinitiate Xarelto after surgery unless oral surgeon recommends otherwise.  Will continue with morphine for post-op pain control and clindamycin for coverage of typical dental flora.  Will likely be able to transfer out of stepdown after surgery. - vital signs per stepdown routine - reinitiate Xarelto after surgery - continue clindamycin 600mg  q 8 hours - zofran for nausea - decadron 5mg  q 6 hours to reduce throat swelling - percocet 1-2 tabs Q4H PRN - morphine 4 mg Q34 PRN for breakthrough pain  History of DVTs Patient chronically on xarelto 20 mg daily.  - will restart Xarelto on 6/10  Type II diabetes with neuropathy A1C 8.9. Better from last A1c which was >11. Lantus 15 units. Likely increased cbg due to steroids. MSSI. Takes glipizide and metformin at home. Likely would benefit from switching glipizide to another oral agent to decrease risk for hypoglycemia. - continue mSSI, increase Lantus to 20 units on 6/9 pm - stop glipizide and start SGLT2 at discharge - continue home Lyrica 200 mg BID - continue home  Cymbalta 60 mg daily  OSA CPAP nightly  Hypertension Well controlled during admission. 119/67 on 6/9 am.  Takes losartan 100 mg daily at home - Continue losartan 100mg  daily  Asthma  No medications at home.  - Duonebs 70mL q 6 hours prn.   Tobacco dependence Nicotine patch 14mg   HLD Continue lipitor 40mg  daily  FEN/GI: advance diet after surgery Prophylaxis: xarelto, will restart 6/10  Disposition: transfer to med-surg, home hopefully 6/10  Subjective:  Linda Gonzalez was in the OR this morning  Objective: Temp:  [97.8 F (36.6 C)-98.7 F (37.1 C)] 97.8 F (36.6 C) (06/09 0436) Pulse Rate:  [56-93] 56 (06/09 0038) Resp:  [13-19] 19 (06/09 0436) BP: (102-134)/(46-70) 130/59 (06/09 0038) SpO2:  [92 %-100 %] 97 % (06/09 0038) Weight:  [250 lb (113.4 kg)] 250 lb (113.4 kg) (06/08 1000) Physical Exam: Could not examine due to patient being in the OR; will see after surgery  Laboratory: Recent Labs  Lab 05/03/18 0446 05/04/18 1103 05/05/18 0359  WBC 11.5* 12.6* 9.3  HGB 11.0* 10.6* 9.9*  HCT 36.1 35.2* 33.1*  PLT 341 256 308   Recent Labs  Lab 05/03/18 0446 05/04/18 1103 05/05/18 0359  NA 139 144 137  K 4.1 3.9 4.2  CL 104 109 104  CO2 26 24 22   BUN 11 9 12   CREATININE 0.71 0.67 0.68  CALCIUM 9.7 9.5 8.7*  PROT  --   --  7.6  BILITOT  --   --  0.3  ALKPHOS  --   --  66  ALT  --   --  12*  AST  --   --  13*  GLUCOSE 211* 140* 246*     Imaging/Diagnostic Tests: Dg Orthopantogram  Result Date: 05/05/2018 CLINICAL DATA:  Facial cellulitis. EXAM: ORTHOPANTOGRAM/PANORAMIC COMPARISON:  CT 05/04/2018 FINDINGS: Examination demonstrates no evidence of fracture. Subtle heterogeneous sclerosis along the body of the mandible right worse than left. Partial tooth fragment over the right lower molar region as well as linear fragment adjacent the left lower molar region. Remainder the exam is unremarkable. IMPRESSION: No acute findings. Small bilateral partial  tooth fragments over the lower molar regions bilaterally. Adjacent mild heterogeneous sclerosis of the body of mandible right worse than left. These are likely reactive changes, although chronic infection could have similar appearance. Electronically Signed   By: Marin Olp M.D.   On: 05/05/2018 00:14   Ct Soft Tissue Neck W Contrast  Result Date: 05/04/2018 CLINICAL DATA:  Progressive facial swelling.  Shortness of breath. EXAM: CT NECK WITH CONTRAST TECHNIQUE: Multidetector CT imaging of the neck was performed using the standard protocol following the bolus administration of intravenous contrast. CONTRAST:  36mL OMNIPAQUE IOHEXOL 300 MG/ML  SOLN COMPARISON:  CT of the face 05/03/2018. FINDINGS: Pharynx and larynx: Prominence of the palatine tonsils has increased since the prior exam. The soft palate is edematous. This narrows the nasopharyngeal airway. The oropharyngeal airway is mildly narrowed, but not obstructed. No focal mucosal or submucosal lesions are present. Salivary glands: The submandibular and parotid glands are within normal limits bilaterally. Thyroid: Thyroid is mildly heterogeneous without a focal lesion. Lymph nodes: Enlarged bilateral level 2 lymph nodes are likely reactive, more prominent right than left. Subcentimeter submandibular lymph nodes and submental lymph nodes are stable. Vascular: Atherosclerotic changes are noted at the arch and proximal great vessels without significant stenosis. Limited intracranial: Negative Visualized orbits: Right greater than left periorbital soft tissue swelling is new since the prior study. This is most prominent lateral and inferior to the right orbit. Globes are unremarkable. Mastoids and visualized paranasal sinuses: Mild mucosal thickening the right maxillary sinus is increased. A small polyp or mucous retention cyst is present in the left maxillary sinus. The remaining paranasal sinuses and the mastoid air cells are clear Skeleton: Degenerative  endplate changes are again noted C5-6 and C6-7. Dental caries are present at every remaining maxillary tooth. Periapical lucencies are again noted at teeth numbers 5, 6, 12, and 14. There is cortical erosion at the roots of #5 and 6 with adjacent subperiosteal abscess. The subperiosteal abscess has increased in size since the prior exam, now measuring 18 x 22 mm. Upper chest: The lung apices are clear. The thoracic inlet is within normal limits. Other: Soft swelling over the right side of the face has increased. There is increased inflammatory change along the right nasal labial fold extending to the inferior periorbital region as described. Diffuse subcutaneous soft tissue stranding is present over the right side of the face and to the anterior chin bilaterally, right greater than left. There is thickening of the platysma. IMPRESSION: 1. Increased diffuse soft tissue swelling over the right side of the face, now including periorbital inflammatory changes bilaterally, right greater than left. This is compatible with progressive cellulitis. 2. Increasing size of subperiosteal abscess adjacent to the odontogenic source along the right-sided mandible. The collection now measures 18 x 22 mm. 3. Increased soft tissue swelling inflammation along the right upper lip and nasal labial fold extending to the inferior periorbital region. 4. Soft tissue swelling anterior to the mandible. 5. Reactive adenopathy. 6. Edematous changes within the soft palate  contribute to narrowing of the nasopharyngeal airway. The oropharyngeal airway is narrowed, but patent. Electronically Signed   By: San Morelle M.D.   On: 05/04/2018 13:23   Ct Maxillofacial W Contrast  Result Date: 05/03/2018 CLINICAL DATA:  Dental pain.  Right facial swelling. EXAM: CT MAXILLOFACIAL WITH CONTRAST TECHNIQUE: Multidetector CT imaging of the maxillofacial structures was performed with intravenous contrast. Multiplanar CT image reconstructions were  also generated. CONTRAST:  11mL OMNIPAQUE IOHEXOL 300 MG/ML  SOLN COMPARISON:  None. FINDINGS: Osseous: No facial fracture. Dental: There is severe dental disease with multiple large caries and numerous missing teeth. All of the maxillary teeth have large caries. There is a periapical lucency at the root of tooth 5 with dehiscence of the anterior maxillary cortex. There is moderate overlying soft tissue swelling that extends into the subcutaneous facial fat. Small, crescentic hypoattenuation overlying the dehiscence may indicate a small subperiosteal abscess. There is also a large periapical lucency at the root of tooth 13. There is dental amalgam within multiple mandibular teeth, which are generally in better condition than the upper teeth. There is partial destruction of the most posterior remaining right mandibular tooth with an associated periapical lucency. Orbits: The globes are intact. Normal appearance of the intra- and extraconal fat. Symmetric extraocular muscles. Sinuses: There is mucosal thickening along the floors of both maxillary sinuses, possibly odontogenic in origin. Soft tissues: As above, soft tissue swelling anterior to the right maxilla. Limited intracranial: Normal. IMPRESSION: 1. Severe dental disease of the maxillary teeth, including periapical lucencies at the roots of teeth 5 and 13. There is dehiscence of the anterior maxillary cortex at both locations. On the right, there is overlying soft tissue inflammation with suspected small subperiosteal abscess. 2. Subcutaneous inflammation of the right cheek is favored to indicate odontogenic cellulitis. 3. 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 Electronically Signed   By: Ulyses Jarred M.D.   On: 05/03/2018 06:19     Kathrene Alu, MD 05/05/2018, 7:26 AM PGY-1, [place] Intern pager: [phone], text pages welcome

## 2018-05-05 NOTE — Progress Notes (Signed)
Patient going to OR for oral surgery. Alert and oriented, no acute distress noted. Short stay was called for report.

## 2018-05-05 NOTE — Discharge Instructions (Signed)
Ice to affected area for 2-3 days. Warm salt water mouth rinses 4-5 times per day starting the day after surgery. Soft diet, advance as tolerated. No smoking for 2 weeks. Follow-up visit with Dr. Hoyt Koch as scheduled. Call [phone] for problems.

## 2018-05-05 NOTE — Op Note (Signed)
Linda Gonzalez, ILL[facility]WOR[facility] MEDICAL RECORD [id number] ACCOUNT [id number] DATE OF BIRTH:1960-12-02 FACILITY: [facility] LOCATION: [id number] PHYSICIAN:Vaudie Engebretsen M. Jakaria Lavergne, DDS  OPERATIVE REPORT  DATE OF PROCEDURE:  05/05/2018  PREOPERATIVE DIAGNOSIS:  Maxillary cellulitis, abscess teeth right and left maxilla.  POSTOPERATIVE DIAGNOSIS:  Maxillary cellulitis, abscess teeth right and left maxilla.  PROCEDURES:   1.  Extraction of teeth 1, 4, 5, 6, 7, 8, 9, 10, 11, 12, 14, 15, 18, 31.   2.  Incision and drainage of abscess, right maxilla. 3.  Alveoplasty right and left maxilla.    SURGEON:   Diona Browner, DDS  ANESTHESIA:  General nasal intubation.  DESCRIPTION OF PROCEDURE:  The patient was taken to the operating room and placed on the table in supine position.  General anesthesia was administered intravenously and a nasal endotracheal tube was placed and secured.  The eyes were protected and the  patient was draped for surgery.  A timeout was performed.  The posterior pharynx was suctioned and a throat pack was placed.  Lidocaine, 2%, 1:100,000 epinephrine was infiltrated in right and left maxilla, buccally and palatally, around the teeth that  were removed and infiltrated immediately around teeth fragments of numbers 18 and 31.  A bite block was placed in the left side of the mouth and a Sweetheart was used to retract the tongue.  A periosteal elevator was used to elevate the coronal root  fragment of tooth #31.  Then, a 15 blade was used to make an incision in the canine fossa of the vestibule of the right maxilla.  Dissection was carried up into the bony region infraorbitally bluntly with a hemostat.  No frank purulence was expressed.   There was some serosanguineous exudate.  Then, a 15 blade was used to make an incision buccally and palatally around teeth 1, 4, 5, 6, 7, 8, 9, 10.  The periosteum was reflected.  The teeth were elevated and removed from the mouth with the 301 elevator,  the rongeur  orthodental forceps.  Then, the sockets were curetted.  Bone was trimmed with a rongeur and smoothed with a bone file in an alveoplasty procedure.  Then, the area was irrigated and closed with 3-0 chromic.  Then, the bite block and sweetheart  retractor were repositioned to the other side of the mouth.  The coronal fragment of tooth #18 was lifted with the periosteal elevator and removed and then teeth numbers 11 12, 14 and 15 were removed using the 15 blade to make circumferential incisions.   A periosteal elevator to reflect the periosteum and then the teeth were elevated and removed with a 301 elevator and dental forceps.  The sockets were then curetted and then bone was removed to perform alveoplasty using the rongeur and egg-shaped bur  and then the area was irrigated and closed with 3-0 chromic.  Then, a quarter inch Penrose drain was placed into the incision in the vestibule of the right maxilla.  It was secured with a 2-0 silk.  Then, the oral cavity was irrigated and suctioned and  the throat pack was removed.  The patient was left in the care of anesthesia for extubation and transport to recovery room and transferred back to the floor.  ESTIMATED BLOOD LOSS:  Minimal.  COMPLICATIONS:  None.  SPECIMENS:  None.  DRAINS:  Quarter inch Penrose right maxilla.  AN/NUANCE  D:05/05/2018 T:05/05/2018 JOB:[id number]

## 2018-05-05 NOTE — Progress Notes (Signed)
Per MD order patient was transferred to Med-Surg level of care. Will continue to monitor.

## 2018-05-05 NOTE — Op Note (Signed)
05/05/2018  10:17 AM  PATIENT:  Linda Gonzalez  57 y.o. female  PRE-OPERATIVE DIAGNOSIS:  Maxillary cellulitis, abscess teeth  POST-OPERATIVE DIAGNOSIS:  SAME  PROCEDURE:  Procedure(s): DENTAL EXTRACTIONS # 1, 4, 5, 6, 7, 8, 9, 10, 11, 12, 14, 15, 18, 31 INCISION AND DRAINAGE ABSCESS R maxilla, alveoloplasty right maxilla  SURGEON:  Surgeon(s): Diona Browner, DDS  ANESTHESIA:   local and general  EBL:  minimal  DRAINS: 1/4 penrose right maxilla  SPECIMEN:  No Specimen  COUNTS:  YES  PLAN OF CARE: Discharge to home after PACU  PATIENT DISPOSITION:  PACU - hemodynamically stable.   PROCEDURE DETAILS: Dictation # [id number]  Gae Bon, DMD 05/05/2018 10:17 AM

## 2018-05-05 NOTE — Anesthesia Procedure Notes (Signed)
Procedure Name: Intubation Date/Time: 05/05/2018 9:45 AM Performed by: Candis Shine, CRNA Pre-anesthesia Checklist: Patient identified, Emergency Drugs available, Suction available and Patient being monitored Patient Re-evaluated:Patient Re-evaluated prior to induction Oxygen Delivery Method: Circle System Utilized Preoxygenation: Pre-oxygenation with 100% oxygen Induction Type: IV induction Ventilation: Mask ventilation without difficulty Laryngoscope Size: Glidescope and 3 Grade View: Grade I Nasal Tubes: Nasal Rae and Nasal prep performed Tube size: 7.0 mm Number of attempts: 1 Airway Equipment and Method: Video-laryngoscopy Placement Confirmation: ETT inserted through vocal cords under direct vision,  positive ETCO2 and breath sounds checked- equal and bilateral Tube secured with: Tape Dental Injury: Teeth and Oropharynx as per pre-operative assessment

## 2018-05-06 ENCOUNTER — Encounter (HOSPITAL_COMMUNITY): Payer: Self-pay | Admitting: Oral Surgery

## 2018-05-06 LAB — COMPREHENSIVE METABOLIC PANEL
ALK PHOS: 58 U/L (ref 38–126)
ALT: 12 U/L — AB (ref 14–54)
AST: 17 U/L (ref 15–41)
Albumin: 2.6 g/dL — ABNORMAL LOW (ref 3.5–5.0)
Anion gap: 10 (ref 5–15)
BUN: 19 mg/dL (ref 6–20)
CALCIUM: 8.3 mg/dL — AB (ref 8.9–10.3)
CO2: 23 mmol/L (ref 22–32)
CREATININE: 0.8 mg/dL (ref 0.44–1.00)
Chloride: 106 mmol/L (ref 101–111)
Glucose, Bld: 165 mg/dL — ABNORMAL HIGH (ref 65–99)
Potassium: 4.1 mmol/L (ref 3.5–5.1)
Sodium: 139 mmol/L (ref 135–145)
Total Bilirubin: 0.2 mg/dL — ABNORMAL LOW (ref 0.3–1.2)
Total Protein: 6.3 g/dL — ABNORMAL LOW (ref 6.5–8.1)

## 2018-05-06 LAB — CBC
HCT: 30.8 % — ABNORMAL LOW (ref 36.0–46.0)
Hemoglobin: 9.2 g/dL — ABNORMAL LOW (ref 12.0–15.0)
MCH: 25 pg — ABNORMAL LOW (ref 26.0–34.0)
MCHC: 29.9 g/dL — AB (ref 30.0–36.0)
MCV: 83.7 fL (ref 78.0–100.0)
PLATELETS: 308 10*3/uL (ref 150–400)
RBC: 3.68 MIL/uL — ABNORMAL LOW (ref 3.87–5.11)
RDW: 18.1 % — AB (ref 11.5–15.5)
WBC: 9.5 10*3/uL (ref 4.0–10.5)

## 2018-05-06 LAB — GLUCOSE, CAPILLARY
Glucose-Capillary: 154 mg/dL — ABNORMAL HIGH (ref 65–99)
Glucose-Capillary: 224 mg/dL — ABNORMAL HIGH (ref 65–99)
Glucose-Capillary: 154 mg/dL — ABNORMAL HIGH (ref 65–99)

## 2018-05-06 LAB — MAGNESIUM: Magnesium: 1.8 mg/dL (ref 1.7–2.4)

## 2018-05-06 MED ORDER — CLINDAMYCIN HCL 300 MG PO CAPS
300.0000 mg | ORAL_CAPSULE | Freq: Three times a day (TID) | ORAL | Status: DC
  Administered 2018-05-06: 300 mg via ORAL
  Filled 2018-05-06 (×3): qty 1

## 2018-05-06 MED ORDER — OXYCODONE-ACETAMINOPHEN 5-325 MG PO TABS: 1.0000 | ORAL_TABLET | ORAL | 0 refills | Status: AC | PRN

## 2018-05-06 MED ORDER — POLYETHYLENE GLYCOL 3350 17 G PO PACK
17.0000 g | PACK | Freq: Two times a day (BID) | ORAL | Status: DC
  Administered 2018-05-06: 17 g via ORAL
  Filled 2018-05-06: qty 1

## 2018-05-06 MED ORDER — RIVAROXABAN 20 MG PO TABS
20.0000 mg | ORAL_TABLET | Freq: Every day | ORAL | Status: DC
  Administered 2018-05-06: 20 mg via ORAL
  Filled 2018-05-06: qty 1

## 2018-05-06 MED ORDER — CLINDAMYCIN HCL 300 MG PO CAPS: 300.0000 mg | ORAL_CAPSULE | Freq: Three times a day (TID) | ORAL | 0 refills | Status: AC

## 2018-05-06 NOTE — Progress Notes (Signed)
Family Medicine Teaching Service Daily Progress Note Intern Pager: [phone]  Patient name: Linda Gonzalez Medical record number: [id number] Date of birth: 12/13/1960 Age: 57 y.o. Gender: female  Primary Care Provider: Sherene Sires, DO Consultants: oral surgery Code Status: full  Pt Overview and Major Events to Date:  Admitted 6/8 6/9 I and D with maxillary dental extractions  Assessment and Plan:  Linda Gonzalez is a 57 y.o. female presenting with pain, swelling, and throat pressure from a suspected mandibular abscess . 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  Patient currently being treated with clindamycin 600 mg Q8H.  On percocet 1-2 tabs Q4H PRN alternating with morphine 4 mg Q4H PRN for pain control and is getting a PRN medication about every three hours.  Okay with Dr. Hoyt Koch to send home today with oral Clindamycin for a 7-day total course. - reinitiate Xarelto after surgery - continue clindamycin 600mg  q 8 hours, transition to oral for 7 day total course (ending June 15) - patient aware that she needs to go by dentist to check on drain after discharge - zofran for nausea - continue percocet 1-2 tabs Q4H PRN - discontinue morphine - will need protein shakes after discharge since PO intake will be difficult - miralax BID for bowel regimen  History of DVTs Patient chronically on xarelto 20 mg daily.  - will restart Xarelto on 6/10  Type II diabetes with neuropathy A1C 8.9. Better from last A1c which was >11. Lantus 20 units, increased from 15 on 6/9.  Likely would benefit from switching glipizide to another oral agent to decrease risk for hypoglycemia. - continue mSSI and Lantus 20 units - stop glipizide and start SGLT2 at  discharge - continue home Lyrica 200 mg BID - continue home Cymbalta 60 mg daily  OSA CPAP nightly  Hypertension Well controlled during admission. 127/65 on 6/10 am.  Takes losartan 100 mg daily at home - Continue losartan 100mg  daily  Asthma  No medications at home.  - Duonebs 43mL q 6 hours prn.   Tobacco dependence Nicotine patch 14mg   HLD Continue lipitor 40mg  daily  FEN/GI: soft diet, ADAT Prophylaxis: xarelto, will restart 6/10  Disposition: discharge today  Subjective:  [name] says she is feeling better since yesterday and would like to go home if possible today.  She understands that she needs to see the dentist this afternoon before she goes home.  Objective: Temp:  [97.8 F (36.6 C)-98.4 F (36.9 C)] 98 F (36.7 C) (06/10 0606) Pulse Rate:  [53-87] 62 (06/10 0606) Resp:  [11-19] 12 (06/10 0606) BP: (104-146)/(53-71) 127/65 (06/10 0606) SpO2:  [91 %-99 %] 98 % (06/10 0606) Weight:  [264 lb 15.9 oz (120.2 kg)] 264 lb 15.9 oz (120.2 kg) (06/10 0606) Physical Exam: Physical Exam  Constitutional: She is oriented to person, place, and time. She appears well-developed and well-nourished. No distress.  HENT:  Some swelling in R maxilla; stitches in place along upper gumline, no bleeding  Eyes: Conjunctivae and EOM are normal.  Cardiovascular: Normal rate, regular rhythm and normal heart sounds.  Pulmonary/Chest: Effort normal and breath sounds normal. No respiratory distress.  Abdominal: Soft. Bowel sounds are normal.  Musculoskeletal: Normal range of motion.  Neurological: She is alert and oriented to person,  place, and time.  Skin: Skin is warm and dry.  Psychiatric: She has a normal mood and affect. Her behavior is normal.     Laboratory: Recent Labs  Lab 05/04/18 1103 05/05/18 0359 05/06/18 0525  WBC 12.6* 9.3 9.5  HGB 10.6* 9.9* 9.2*  HCT 35.2* 33.1* 30.8*  PLT 256 308 308   Recent Labs  Lab 05/04/18 1103 05/05/18 0359  05/06/18 0525  NA 144 137 139  K 3.9 4.2 4.1  CL 109 104 106  CO2 24 22 23   BUN 9 12 19   CREATININE 0.67 0.68 0.80  CALCIUM 9.5 8.7* 8.3*  PROT  --  7.6 6.3*  BILITOT  --  0.3 0.2*  ALKPHOS  --  66 58  ALT  --  12* 12*  AST  --  13* 17  GLUCOSE 140* 246* 165*     Imaging/Diagnostic Tests: Dg Orthopantogram  Result Date: 05/05/2018 CLINICAL DATA:  Facial cellulitis. EXAM: ORTHOPANTOGRAM/PANORAMIC COMPARISON:  CT 05/04/2018 FINDINGS: Examination demonstrates no evidence of fracture. Subtle heterogeneous sclerosis along the body of the mandible right worse than left. Partial tooth fragment over the right lower molar region as well as linear fragment adjacent the left lower molar region. Remainder the exam is unremarkable. IMPRESSION: No acute findings. Small bilateral partial tooth fragments over the lower molar regions bilaterally. Adjacent mild heterogeneous sclerosis of the body of mandible right worse than left. These are likely reactive changes, although chronic infection could have similar appearance. Electronically Signed   By: Marin Olp M.D.   On: 05/05/2018 00:14   Ct Soft Tissue Neck W Contrast  Result Date: 05/04/2018 CLINICAL DATA:  Progressive facial swelling.  Shortness of breath. EXAM: CT NECK WITH CONTRAST TECHNIQUE: Multidetector CT imaging of the neck was performed using the standard protocol following the bolus administration of intravenous contrast. CONTRAST:  52mL OMNIPAQUE IOHEXOL 300 MG/ML  SOLN COMPARISON:  CT of the face 05/03/2018. FINDINGS: Pharynx and larynx: Prominence of the palatine tonsils has increased since the prior exam. The soft palate is edematous. This narrows the nasopharyngeal airway. The oropharyngeal airway is mildly narrowed, but not obstructed. No focal mucosal or submucosal lesions are present. Salivary glands: The submandibular and parotid glands are within normal limits bilaterally. Thyroid: Thyroid is mildly heterogeneous without a focal  lesion. Lymph nodes: Enlarged bilateral level 2 lymph nodes are likely reactive, more prominent right than left. Subcentimeter submandibular lymph nodes and submental lymph nodes are stable. Vascular: Atherosclerotic changes are noted at the arch and proximal great vessels without significant stenosis. Limited intracranial: Negative Visualized orbits: Right greater than left periorbital soft tissue swelling is new since the prior study. This is most prominent lateral and inferior to the right orbit. Globes are unremarkable. Mastoids and visualized paranasal sinuses: Mild mucosal thickening the right maxillary sinus is increased. A small polyp or mucous retention cyst is present in the left maxillary sinus. The remaining paranasal sinuses and the mastoid air cells are clear Skeleton: Degenerative endplate changes are again noted C5-6 and C6-7. Dental caries are present at every remaining maxillary tooth. Periapical lucencies are again noted at teeth numbers 5, 6, 12, and 14. There is cortical erosion at the roots of #5 and 6 with adjacent subperiosteal abscess. The subperiosteal abscess has increased in size since the prior exam, now measuring 18 x 22 mm. Upper chest: The lung apices are clear. The thoracic inlet is within normal limits. Other: Soft swelling over the right side of the face has increased. There is  increased inflammatory change along the right nasal labial fold extending to the inferior periorbital region as described. Diffuse subcutaneous soft tissue stranding is present over the right side of the face and to the anterior chin bilaterally, right greater than left. There is thickening of the platysma. IMPRESSION: 1. Increased diffuse soft tissue swelling over the right side of the face, now including periorbital inflammatory changes bilaterally, right greater than left. This is compatible with progressive cellulitis. 2. Increasing size of subperiosteal abscess adjacent to the odontogenic source along  the right-sided mandible. The collection now measures 18 x 22 mm. 3. Increased soft tissue swelling inflammation along the right upper lip and nasal labial fold extending to the inferior periorbital region. 4. Soft tissue swelling anterior to the mandible. 5. Reactive adenopathy. 6. Edematous changes within the soft palate contribute to narrowing of the nasopharyngeal airway. The oropharyngeal airway is narrowed, but patent. Electronically Signed   By: San Morelle M.D.   On: 05/04/2018 13:23   Ct Maxillofacial W Contrast  Result Date: 05/03/2018 CLINICAL DATA:  Dental pain.  Right facial swelling. EXAM: CT MAXILLOFACIAL WITH CONTRAST TECHNIQUE: Multidetector CT imaging of the maxillofacial structures was performed with intravenous contrast. Multiplanar CT image reconstructions were also generated. CONTRAST:  46mL OMNIPAQUE IOHEXOL 300 MG/ML  SOLN COMPARISON:  None. FINDINGS: Osseous: No facial fracture. Dental: There is severe dental disease with multiple large caries and numerous missing teeth. All of the maxillary teeth have large caries. There is a periapical lucency at the root of tooth 5 with dehiscence of the anterior maxillary cortex. There is moderate overlying soft tissue swelling that extends into the subcutaneous facial fat. Small, crescentic hypoattenuation overlying the dehiscence may indicate a small subperiosteal abscess. There is also a large periapical lucency at the root of tooth 13. There is dental amalgam within multiple mandibular teeth, which are generally in better condition than the upper teeth. There is partial destruction of the most posterior remaining right mandibular tooth with an associated periapical lucency. Orbits: The globes are intact. Normal appearance of the intra- and extraconal fat. Symmetric extraocular muscles. Sinuses: There is mucosal thickening along the floors of both maxillary sinuses, possibly odontogenic in origin. Soft tissues: As above, soft tissue  swelling anterior to the right maxilla. Limited intracranial: Normal. IMPRESSION: 1. Severe dental disease of the maxillary teeth, including periapical lucencies at the roots of teeth 5 and 13. There is dehiscence of the anterior maxillary cortex at both locations. On the right, there is overlying soft tissue inflammation with suspected small subperiosteal abscess. 2. Subcutaneous inflammation of the right cheek is favored to indicate odontogenic cellulitis. 3. 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 Electronically Signed   By: Ulyses Jarred M.D.   On: 05/03/2018 06:19     Kathrene Alu, MD 05/06/2018, 7:06 AM PGY-1, [place] Intern pager: [phone], text pages welcome

## 2018-05-06 NOTE — Progress Notes (Signed)
Milus Banister Gasser to be D/C'd Home per MD order.  Discussed with the patient and all questions fully answered.  VSS, Skin clean, dry and intact without evidence of skin break down, no evidence of skin tears noted. IV catheter discontinued intact. Site without signs and symptoms of complications. Dressing and pressure applied.  An After Visit Summary was printed and given to the patient. Patient received prescription.  D/c education completed with patient/family including follow up instructions, medication list, d/c activities limitations if indicated, with other d/c instructions as indicated by MD - patient able to verbalize understanding, all questions fully answered.   Patient instructed to return to ED, call 911, or call MD for any changes in condition.   Patient escorted via [place], and D/C home via private auto.  Luci Bank 05/06/2018 4:45 PM

## 2018-05-06 NOTE — Progress Notes (Signed)
Linda Gonzalez PROGRESS NOTE:   SUBJECTIVE: feeling much better  OBJECTIVE:  Vitals: Blood pressure 127/65, pulse 62, temperature 98 F (36.7 C), temperature source Oral, resp. rate 12, height 5\' 6"  (1.676 m), weight 264 lb 15.9 oz (120.2 kg), last menstrual period 11/02/2012, SpO2 98 %. Lab results: Results for orders placed or performed during the hospital encounter of 05/04/18 (from the past 24 hour(s))  Glucose, capillary     Status: Abnormal   Collection Time: 05/05/18 10:30 AM  Result Value Ref Range   Glucose-Capillary 236 (H) 65 - 99 mg/dL  Glucose, capillary     Status: Abnormal   Collection Time: 05/05/18 12:05 PM  Result Value Ref Range   Glucose-Capillary 203 (H) 65 - 99 mg/dL  Glucose, capillary     Status: Abnormal   Collection Time: 05/05/18  4:57 PM  Result Value Ref Range   Glucose-Capillary 203 (H) 65 - 99 mg/dL  Glucose, capillary     Status: Abnormal   Collection Time: 05/05/18  8:59 PM  Result Value Ref Range   Glucose-Capillary 203 (H) 65 - 99 mg/dL  Comprehensive metabolic panel     Status: Abnormal   Collection Time: 05/06/18  5:25 AM  Result Value Ref Range   Sodium 139 135 - 145 mmol/L   Potassium 4.1 3.5 - 5.1 mmol/L   Chloride 106 101 - 111 mmol/L   CO2 23 22 - 32 mmol/L   Glucose, Bld 165 (H) 65 - 99 mg/dL   BUN 19 6 - 20 mg/dL   Creatinine, Ser 0.80 0.44 - 1.00 mg/dL   Calcium 8.3 (L) 8.9 - 10.3 mg/dL   Total Protein 6.3 (L) 6.5 - 8.1 g/dL   Albumin 2.6 (L) 3.5 - 5.0 g/dL   AST 17 15 - 41 U/L   ALT 12 (L) 14 - 54 U/L   Alkaline Phosphatase 58 38 - 126 U/L   Total Bilirubin 0.2 (L) 0.3 - 1.2 mg/dL   GFR calc non Af Amer >60 >60 mL/min   GFR calc Af Amer >60 >60 mL/min   Anion gap 10 5 - 15  Magnesium     Status: None   Collection Time: 05/06/18  5:25 AM  Result Value Ref Range   Magnesium 1.8 1.7 - 2.4 mg/dL  CBC     Status: Abnormal   Collection Time: 05/06/18  5:25 AM  Result Value Ref Range   WBC 9.5 4.0 - 10.5 K/uL   RBC 3.68  (L) 3.87 - 5.11 MIL/uL   Hemoglobin 9.2 (L) 12.0 - 15.0 g/dL   HCT 30.8 (L) 36.0 - 46.0 %   MCV 83.7 78.0 - 100.0 fL   MCH 25.0 (L) 26.0 - 34.0 pg   MCHC 29.9 (L) 30.0 - 36.0 g/dL   RDW 18.1 (H) 11.5 - 15.5 %   Platelets 308 150 - 400 K/uL   Radiology Results: Dg Orthopantogram  Result Date: 05/05/2018 CLINICAL DATA:  Facial cellulitis. EXAM: ORTHOPANTOGRAM/PANORAMIC COMPARISON:  CT 05/04/2018 FINDINGS: Examination demonstrates no evidence of fracture. Subtle heterogeneous sclerosis along the body of the mandible right worse than left. Partial tooth fragment over the right lower molar region as well as linear fragment adjacent the left lower molar region. Remainder the exam is unremarkable. IMPRESSION: No acute findings. Small bilateral partial tooth fragments over the lower molar regions bilaterally. Adjacent mild heterogeneous sclerosis of the body of mandible right worse than left. These are likely reactive changes, although chronic infection could have similar appearance. Electronically  Signed   By: Marin Olp M.D.   On: 05/05/2018 00:14   Ct Soft Tissue Neck W Contrast  Result Date: 05/04/2018 CLINICAL DATA:  Progressive facial swelling.  Shortness of breath. EXAM: CT NECK WITH CONTRAST TECHNIQUE: Multidetector CT imaging of the neck was performed using the standard protocol following the bolus administration of intravenous contrast. CONTRAST:  61mL OMNIPAQUE IOHEXOL 300 MG/ML  SOLN COMPARISON:  CT of the face 05/03/2018. FINDINGS: Pharynx and larynx: Prominence of the palatine tonsils has increased since the prior exam. The soft palate is edematous. This narrows the nasopharyngeal airway. The oropharyngeal airway is mildly narrowed, but not obstructed. No focal mucosal or submucosal lesions are present. Salivary glands: The submandibular and parotid glands are within normal limits bilaterally. Thyroid: Thyroid is mildly heterogeneous without a focal lesion. Lymph nodes: Enlarged bilateral level  2 lymph nodes are likely reactive, more prominent right than left. Subcentimeter submandibular lymph nodes and submental lymph nodes are stable. Vascular: Atherosclerotic changes are noted at the arch and proximal great vessels without significant stenosis. Limited intracranial: Negative Visualized orbits: Right greater than left periorbital soft tissue swelling is new since the prior study. This is most prominent lateral and inferior to the right orbit. Globes are unremarkable. Mastoids and visualized paranasal sinuses: Mild mucosal thickening the right maxillary sinus is increased. A small polyp or mucous retention cyst is present in the left maxillary sinus. The remaining paranasal sinuses and the mastoid air cells are clear Skeleton: Degenerative endplate changes are again noted C5-6 and C6-7. Dental caries are present at every remaining maxillary tooth. Periapical lucencies are again noted at teeth numbers 5, 6, 12, and 14. There is cortical erosion at the roots of #5 and 6 with adjacent subperiosteal abscess. The subperiosteal abscess has increased in size since the prior exam, now measuring 18 x 22 mm. Upper chest: The lung apices are clear. The thoracic inlet is within normal limits. Other: Soft swelling over the right side of the face has increased. There is increased inflammatory change along the right nasal labial fold extending to the inferior periorbital region as described. Diffuse subcutaneous soft tissue stranding is present over the right side of the face and to the anterior chin bilaterally, right greater than left. There is thickening of the platysma. IMPRESSION: 1. Increased diffuse soft tissue swelling over the right side of the face, now including periorbital inflammatory changes bilaterally, right greater than left. This is compatible with progressive cellulitis. 2. Increasing size of subperiosteal abscess adjacent to the odontogenic source along the right-sided mandible. The collection now  measures 18 x 22 mm. 3. Increased soft tissue swelling inflammation along the right upper lip and nasal labial fold extending to the inferior periorbital region. 4. Soft tissue swelling anterior to the mandible. 5. Reactive adenopathy. 6. Edematous changes within the soft palate contribute to narrowing of the nasopharyngeal airway. The oropharyngeal airway is narrowed, but patent. Electronically Signed   By: San Morelle M.D.   On: 05/04/2018 13:23   General appearance: alert, cooperative and no distress Head: Normocephalic, without obvious abnormality, atraumatic Eyes: negative, mild infraorbital edema Nose: Nares normal. Septum midline. Mucosa normal. No drainage or sinus tenderness. Throat: hemostatic, drain intact, no drainage, pharynx clear Neck: no adenopathy  ASSESSMENT: Stable for D/C post op Iand D, multiple extractions  PLAN: D/c per hospitalist. Pt to office for drain removal at discharge.   Diona Browner 05/06/2018

## 2018-05-06 NOTE — Progress Notes (Signed)
CSW received consult regarding transportation needs. Patient reports having no one to come and get her and no money to pay for a cab. CSW placed taxi voucher on chart, approved by [place] Surveyor, quantity.  CSW signing off.  Percell Locus Rocio Roam LCSW [phone]

## 2018-05-06 NOTE — Plan of Care (Signed)
  Problem: Education: Goal: Knowledge of General Education information will improve Outcome: Progressing Note:  POC reviewed with pt.; informed that mouth rinses ordered.

## 2018-05-07 NOTE — Discharge Summary (Signed)
[facility] Discharge Summary  Patient name: Linda Gonzalez Medical record number: [id number] Date of birth: June 20, 1961 Age: 57 y.o. Gender: female Date of Admission: 05/04/2018  Date of Discharge: 05/06/18 Admitting Physician: Zenia Resides, MD  Primary Care Provider: Sherene Sires, DO Consultants: Oral surgery  Indication for Hospitalization: maxillary abscess  Discharge Diagnoses/Problem List:  Right maxillary abscess, now drained Hx of DVT T2DM with neuropathy OSA HTN Asthma Tobacco dependence HLD  Disposition: home  Discharge Condition: stable, improved  Discharge Exam:  Constitutional: She is oriented to person, place, and time. She appears well-developed and well-nourished. No distress.  HENT:  Some swelling in R maxilla; stitches in place along upper gumline, no bleeding  Eyes: Conjunctivae and EOM are normal.  Cardiovascular: Normal rate, regular rhythm and normal heart sounds.  Pulmonary/Chest: Effort normal and breath sounds normal. No respiratory distress.  Abdominal: Soft. Bowel sounds are normal.  Musculoskeletal: Normal range of motion.  Neurological: She is alert and oriented to person, place, and time.  Skin: Skin is warm and dry.  Psychiatric: She has a normal mood and affect. Her behavior is normal.   Brief Hospital Course:  [name] was admitted on 05/04/18 with pain and swelling due to maxillary abscesses secondary to poor dentition.  She was given IV clindamycin since she had failed outpatient oral clindamycin, and oral surgery was consulted.  She was taken to the OR on 05/05/18 for extraction of 14 teeth and I&D of the right maxillary abscess.  She tolerated the procedure well and was given percocet and morphine for pain relief on 6/9.  She was able to tolerate removal of morphine on 6/10.  She was given one week's worth of percocet and oral clindamycin for a full course of 7 days (will finish abx on 6/15).  She planned to  follow up with Dr. Hoyt Koch on 6/10 outpatient then set up with a dentist closer to her home.  Issues for Follow Up:  1. Due to removal of many of her teeth, she will need her diet supplemented with protein shakes.   2. Follow up with dentistry is extremely important given her many extractions.  She will need to be referred to dentists accepting Medicaid.  Significant Procedures: dental extractions and I&D of maxillary abscess  Significant Labs and Imaging:  Recent Labs  Lab 05/04/18 1103 05/05/18 0359 05/06/18 0525  WBC 12.6* 9.3 9.5  HGB 10.6* 9.9* 9.2*  HCT 35.2* 33.1* 30.8*  PLT 256 308 308   Recent Labs  Lab 05/03/18 0446 05/04/18 1103 05/05/18 0359 05/06/18 0525  NA 139 144 137 139  K 4.1 3.9 4.2 4.1  CL 104 109 104 106  CO2 26 24 22 23   GLUCOSE 211* 140* 246* 165*  BUN 11 9 12 19   CREATININE 0.71 0.67 0.68 0.80  CALCIUM 9.7 9.5 8.7* 8.3*  MG  --   --  1.6* 1.8  ALKPHOS  --   --  66 58  AST  --   --  13* 17  ALT  --   --  12* 12*  ALBUMIN  --   --  2.8* 2.6*     Results/Tests Pending at Time of Discharge: none  Discharge Medications:  Allergies as of 05/06/2018      Reactions   Ace Inhibitors Cough   Phenergan [promethazine Hcl] Rash   Repaglinide Nausea And Vomiting   Vancomycin Rash      Medication List    STOP taking these  medications   glipiZIDE 5 MG 24 hr tablet Commonly known as:  GLUCOTROL XL     TAKE these medications   atorvastatin 40 MG tablet Commonly known as:  LIPITOR Take 1 tablet (40 mg total) by mouth daily.   blood glucose meter kit and supplies Kit Use up to four times daily as directed.   clindamycin 300 MG capsule Commonly known as:  CLEOCIN Take 1 capsule (300 mg total) by mouth every 8 (eight) hours for 5 days.   DULoxetine 60 MG capsule Commonly known as:  CYMBALTA Take 1 capsule (60 mg total) by mouth daily.   losartan 100 MG tablet Commonly known as:  COZAAR Take 1 tablet (100 mg total) by mouth daily.   LYRICA  200 MG capsule Generic drug:  pregabalin Take 1 capsule by mouth 2 (two) times daily.   metFORMIN 750 MG 24 hr tablet Commonly known as:  GLUCOPHAGE XR Take 1 tablet (750 mg total) by mouth 2 (two) times daily.   methocarbamol 750 MG tablet Commonly known as:  ROBAXIN Take 1 tablet by mouth 3 (three) times daily as needed.   oxyCODONE-acetaminophen 5-325 MG tablet Commonly known as:  PERCOCET/ROXICET Take 1-2 tablets by mouth every 4 (four) hours as needed for moderate pain. Notes to patient:  Last given at 340 pm 6/10   rivaroxaban 20 MG Tabs tablet Commonly known as:  XARELTO Take 1 tablet (20 mg total) by mouth daily with supper.   VITAMIN D PO Take 1 capsule by mouth daily.       Discharge Instructions: Please refer to Patient Instructions section of EMR for full details.  Patient was counseled important signs and symptoms that should prompt return to medical care, changes in medications, dietary instructions, activity restrictions, and follow up appointments.   Follow-Up Appointments:   Kathrene Alu, MD 05/07/2018, 9:33 AM PGY-1, [place]

## 2018-05-11 ENCOUNTER — Encounter (HOSPITAL_COMMUNITY): Payer: Self-pay

## 2018-05-11 ENCOUNTER — Other Ambulatory Visit: Payer: Self-pay

## 2018-05-11 ENCOUNTER — Emergency Department (HOSPITAL_COMMUNITY)
Admission: EM | Admit: 2018-05-11 | Discharge: 2018-05-11 | Disposition: A | Discharge status: Home / Self Care | Payer: Medicare Other | Attending: Emergency Medicine | Admitting: Emergency Medicine

## 2018-05-11 ENCOUNTER — Emergency Department (HOSPITAL_COMMUNITY): Payer: Medicare Other

## 2018-05-11 DIAGNOSIS — Z7901 Long term (current) use of anticoagulants: Secondary | ICD-10-CM | POA: Diagnosis not present

## 2018-05-11 DIAGNOSIS — Z79899 Other long term (current) drug therapy: Secondary | ICD-10-CM | POA: Insufficient documentation

## 2018-05-11 DIAGNOSIS — E119 Type 2 diabetes mellitus without complications: Secondary | ICD-10-CM | POA: Insufficient documentation

## 2018-05-11 DIAGNOSIS — E785 Hyperlipidemia, unspecified: Secondary | ICD-10-CM | POA: Diagnosis not present

## 2018-05-11 DIAGNOSIS — Z86718 Personal history of other venous thrombosis and embolism: Secondary | ICD-10-CM | POA: Diagnosis not present

## 2018-05-11 DIAGNOSIS — R110 Nausea: Secondary | ICD-10-CM | POA: Diagnosis not present

## 2018-05-11 DIAGNOSIS — I10 Essential (primary) hypertension: Secondary | ICD-10-CM | POA: Insufficient documentation

## 2018-05-11 DIAGNOSIS — R062 Wheezing: Secondary | ICD-10-CM | POA: Insufficient documentation

## 2018-05-11 DIAGNOSIS — Z7984 Long term (current) use of oral hypoglycemic drugs: Secondary | ICD-10-CM | POA: Insufficient documentation

## 2018-05-11 DIAGNOSIS — J449 Chronic obstructive pulmonary disease, unspecified: Secondary | ICD-10-CM | POA: Insufficient documentation

## 2018-05-11 DIAGNOSIS — F17210 Nicotine dependence, cigarettes, uncomplicated: Secondary | ICD-10-CM | POA: Insufficient documentation

## 2018-05-11 DIAGNOSIS — J9801 Acute bronchospasm: Secondary | ICD-10-CM | POA: Insufficient documentation

## 2018-05-11 DIAGNOSIS — E876 Hypokalemia: Secondary | ICD-10-CM | POA: Insufficient documentation

## 2018-05-11 LAB — URINALYSIS, ROUTINE W REFLEX MICROSCOPIC
BACTERIA UA: NONE SEEN
Bilirubin Urine: NEGATIVE
GLUCOSE, UA: NEGATIVE mg/dL
HGB URINE DIPSTICK: NEGATIVE
Ketones, ur: 5 mg/dL — AB
Leukocytes, UA: NEGATIVE
NITRITE: NEGATIVE
PH: 8 (ref 5.0–8.0)
Protein, ur: 30 mg/dL — AB
SPECIFIC GRAVITY, URINE: 1.029 (ref 1.005–1.030)

## 2018-05-11 LAB — CBC WITH DIFFERENTIAL/PLATELET
BASOS ABS: 0 10*3/uL (ref 0.0–0.1)
BASOS PCT: 0 %
EOS ABS: 0.1 10*3/uL (ref 0.0–0.7)
Eosinophils Relative: 1 %
HEMATOCRIT: 33.5 % — AB (ref 36.0–46.0)
Hemoglobin: 10.4 g/dL — ABNORMAL LOW (ref 12.0–15.0)
Lymphocytes Relative: 37 %
Lymphs Abs: 3.3 10*3/uL (ref 0.7–4.0)
MCH: 25.7 pg — ABNORMAL LOW (ref 26.0–34.0)
MCHC: 31 g/dL (ref 30.0–36.0)
MCV: 82.7 fL (ref 78.0–100.0)
MONO ABS: 0.5 10*3/uL (ref 0.1–1.0)
Monocytes Relative: 6 %
NEUTROS ABS: 5 10*3/uL (ref 1.7–7.7)
Neutrophils Relative %: 56 %
PLATELETS: 315 10*3/uL (ref 150–400)
RBC: 4.05 MIL/uL (ref 3.87–5.11)
RDW: 17.9 % — AB (ref 11.5–15.5)
WBC: 8.8 10*3/uL (ref 4.0–10.5)

## 2018-05-11 LAB — COMPREHENSIVE METABOLIC PANEL
ALT: 11 U/L — ABNORMAL LOW (ref 14–54)
ANION GAP: 9 (ref 5–15)
AST: 13 U/L — AB (ref 15–41)
Albumin: 3.2 g/dL — ABNORMAL LOW (ref 3.5–5.0)
Alkaline Phosphatase: 67 U/L (ref 38–126)
BILIRUBIN TOTAL: 0.4 mg/dL (ref 0.3–1.2)
BUN: 9 mg/dL (ref 6–20)
CALCIUM: 8.4 mg/dL — AB (ref 8.9–10.3)
CO2: 25 mmol/L (ref 22–32)
Chloride: 103 mmol/L (ref 101–111)
Creatinine, Ser: 0.57 mg/dL (ref 0.44–1.00)
GFR calc Af Amer: >60 mL/min (ref >60)
Glucose, Bld: 140 mg/dL — ABNORMAL HIGH (ref 65–99)
POTASSIUM: 3.4 mmol/L — AB (ref 3.5–5.1)
Sodium: 137 mmol/L (ref 135–145)
TOTAL PROTEIN: 7.1 g/dL (ref 6.5–8.1)

## 2018-05-11 LAB — BRAIN NATRIURETIC PEPTIDE: B Natriuretic Peptide: 246 pg/mL — ABNORMAL HIGH (ref 0.0–100.0)

## 2018-05-11 LAB — LIPASE, BLOOD: LIPASE: 26 U/L (ref 11–51)

## 2018-05-11 LAB — TROPONIN I: Troponin I: <0.03 ng/mL (ref <0.03)

## 2018-05-11 MED ORDER — IPRATROPIUM-ALBUTEROL 0.5-2.5 (3) MG/3ML IN SOLN
3.0000 mL | Freq: Once | RESPIRATORY_TRACT | Status: AC
  Administered 2018-05-11: 3 mL via RESPIRATORY_TRACT
  Filled 2018-05-11: qty 3

## 2018-05-11 MED ORDER — ALBUTEROL SULFATE (2.5 MG/3ML) 0.083% IN NEBU
2.5000 mg | INHALATION_SOLUTION | Freq: Once | RESPIRATORY_TRACT | Status: AC
  Administered 2018-05-11: 2.5 mg via RESPIRATORY_TRACT
  Filled 2018-05-11: qty 3

## 2018-05-11 MED ORDER — ALBUTEROL SULFATE HFA 108 (90 BASE) MCG/ACT IN AERS: 2.0000 | INHALATION_SPRAY | RESPIRATORY_TRACT | 0 refills | Status: AC | PRN

## 2018-05-11 MED ORDER — ONDANSETRON 4 MG PO TBDP
4.0000 mg | ORAL_TABLET | Freq: Once | ORAL | Status: AC
  Administered 2018-05-11: 4 mg via ORAL
  Filled 2018-05-11: qty 1

## 2018-05-11 MED ORDER — FUROSEMIDE 10 MG/ML IJ SOLN
20.0000 mg | Freq: Once | INTRAMUSCULAR | Status: AC
Start: 2018-05-11 — End: 2018-05-11
  Administered 2018-05-11: 20 mg via INTRAVENOUS
  Filled 2018-05-11: qty 2

## 2018-05-11 MED ORDER — POTASSIUM CHLORIDE CRYS ER 20 MEQ PO TBCR
40.0000 meq | EXTENDED_RELEASE_TABLET | Freq: Once | ORAL | Status: AC
  Administered 2018-05-11: 40 meq via ORAL
  Filled 2018-05-11: qty 2

## 2018-05-11 MED ORDER — OXYCODONE-ACETAMINOPHEN 5-325 MG PO TABS
1.0000 | ORAL_TABLET | Freq: Once | ORAL | Status: AC
  Administered 2018-05-11: 1 via ORAL
  Filled 2018-05-11: qty 1

## 2018-05-11 MED ORDER — ONDANSETRON 4 MG PO TBDP: 4.0000 mg | ORAL_TABLET | Freq: Three times a day (TID) | ORAL | 0 refills | Status: AC | PRN

## 2018-05-11 MED ORDER — SODIUM CHLORIDE 0.9 % IV BOLUS
1000.0000 mL | Freq: Once | INTRAVENOUS | Status: AC
  Administered 2018-05-11: 1000 mL via INTRAVENOUS

## 2018-05-11 NOTE — ED Triage Notes (Signed)
Pt brought in by EMS. Pt reports that she had 15 teeth extracted last Sunday at [facility]. Pt has been on Abt since. Pt reports that she has been nauseated, headache, unable to take a deep breath. CBG 136. Pt reports unable to eat anything today

## 2018-05-11 NOTE — ED Notes (Signed)
Patient drank po fluids without any difficulties. 

## 2018-05-11 NOTE — Discharge Instructions (Signed)
Take the prescriptions as directed. Finish your antibiotic prescription and continue to take your usual prescriptions as previously directed.  Increase your fluid intake (ie:  Gatoraide) for the next few days.  Call your regular medical doctor Monday to schedule a follow up appointment in the next 3 days. Go to your Oral Surgeon's office on Monday as previously scheduled.  Return to the Emergency Department immediately sooner if worsening.

## 2018-05-11 NOTE — ED Provider Notes (Signed)
[facility] EMERGENCY DEPARTMENT Provider Note   CSN: [id number] Arrival date & time: 05/11/18  1733     History   Chief Complaint Chief Complaint  Patient presents with  . Nausea    HPI Linda Gonzalez is a 57 y.o. female.  HPI  Pt was seen at [place].  Per pt, c/o gradual onset and persistence of constant multiple symptoms since yesterday. Complaints include: SOB, wheezing, generalized chest tightness, nausea, decreased appetite and PO intake. Pt endorses compliance with her xarelto. Pt states she has been taking her abx as prescribed upon discharge from [facility] 5 days ago for dental abscess. States she is due to f/u with DDS on Monday (in 2 days). Denies CP/palpitations, no abd pain, no vomiting/diarrhea, no fevers, no oral drainage or swelling.   Past Medical History:  Diagnosis Date  . Arthritis   . Asthma   . Chronic back pain   . COPD (chronic obstructive pulmonary disease) ([place])   . Dental abscess   . Diabetes mellitus without complication ([place])   . DVT, lower extremity ([place])   . Hyperlipidemia   . Hypertension   . Lumbar radiculopathy   . Neuromuscular disorder ([place])   . OSA (obstructive sleep apnea) 10/10/2017  . Situational depression 10/04/2017    Patient Active Problem List   Diagnosis Date Noted  . Dental abscess   . Abscess of oral space 05/04/2018  . Dental caries 05/04/2018  . Facial swelling 05/04/2018  . Leukocytosis 05/04/2018  . Subperiosteal abscess of jaw 05/04/2018  . Facial cellulitis 05/04/2018  . Adenopathy, cervical 05/04/2018  . Soft palate edema 05/04/2018  . Oropharynx infection 05/04/2018  . Anticoagulated 05/04/2018  . Chronic pain syndrome 03/09/2018  . Aortic atherosclerosis ([place]) 01/31/2018  . Vitamin D deficiency 10/10/2017  . OSA (obstructive sleep apnea) 10/10/2017  . Chronic gastritis 10/10/2017  . Tubular adenoma of colon 10/10/2017  . Tobacco abuse 10/04/2017  . Situational depression 10/04/2017  . HTN (hypertension)  10/04/2017  . HLD (hyperlipidemia) 10/04/2017  . Uncontrolled type 2 diabetes mellitus ([place]) 10/04/2017  . Class 2 obesity due to excess calories with body mass index (BMI) of 39.0 to 39.9 in adult 10/04/2017  . Chronic radicular low back pain 10/04/2017  . 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 History of DVT of lower extremity 10/04/2017    Past Surgical History:  Procedure Laterality Date  . BACK SURGERY  2015  . EXPLORATORY LAPAROTOMY    . FRACTURE SURGERY    . INCISION AND DRAINAGE ABSCESS N/A 05/05/2018   Procedure: INCISION AND DRAINAGE ABSCESS;  Surgeon: Diona Browner, DDS;  Location: [place];  Service: Oral Surgery;  Laterality: N/A;  . SHOULDER SURGERY  2010   right  . SPINE SURGERY     LUMBAR SPINE  . TOOTH EXTRACTION N/A 05/05/2018   Procedure: DENTAL RESTORATION/EXTRACTIONS;  Surgeon: Diona Browner, DDS;  Location: [place];  Service: Oral Surgery;  Laterality: N/A;     OB History   None      Home Medications    Prior to Admission medications   Medication Sig Start Date End Date Taking? Authorizing Provider  atorvastatin (LIPITOR) 40 MG tablet Take 1 tablet (40 mg total) by mouth daily. 03/07/18  Yes Sherene Sires, DO  Cholecalciferol (VITAMIN D PO) Take 1 capsule by mouth daily.   Yes [provider]  clindamycin (CLEOCIN) 300 MG capsule Take 1 capsule (300 mg total) by mouth every 8 (eight) hours for 5 days.  05/06/18 05/11/18 Yes Winfrey, Alcario Drought, MD  DULoxetine (CYMBALTA) 60 MG capsule Take 1 capsule (60 mg total) by mouth daily. 03/07/18  Yes Bland, Scott, DO  losartan (COZAAR) 100 MG tablet Take 1 tablet (100 mg total) by mouth daily. 03/07/18  Yes Bland, Scott, DO  LYRICA 200 MG capsule Take 1 capsule by mouth 2 (two) times daily.  03/27/18  Yes [provider]  metFORMIN (GLUCOPHAGE XR) 750 MG 24 hr tablet Take 1 tablet (750 mg total) by mouth 2 (two) times daily. 03/07/18  Yes Sherene Sires, DO  oxyCODONE-acetaminophen  (PERCOCET/ROXICET) 5-325 MG tablet Take 1-2 tablets by mouth every 4 (four) hours as needed for moderate pain. 05/06/18  Yes Winfrey, Alcario Drought, MD  rivaroxaban (XARELTO) 20 MG TABS tablet Take 1 tablet (20 mg total) by mouth daily with supper. 03/07/18  Yes Bland, Scott, DO  blood glucose meter kit and supplies KIT Use up to four times daily as directed. 04/02/18   Dickie La, MD  methocarbamol (ROBAXIN) 750 MG tablet Take 1 tablet by mouth 3 (three) times daily as needed. 03/27/18   [provider]    Family History Family History  Problem Relation Age of Onset  . Alcohol abuse Mother   . Arthritis Mother   . Mental illness Mother   . Diabetes Father   . Heart disease Father 75  . Heart disease Brother 99  . Learning disabilities Son   . Tuberous sclerosis Son     Social History Social History   Tobacco Use  . Smoking status: Current Every Day Smoker    Packs/day: 1.00    Types: Cigarettes    Start date: 11/27/1978  . Smokeless tobacco: Never Used  Substance Use Topics  . Alcohol use: No    Frequency: Never  . Drug use: No     Allergies   Ace inhibitors; Phenergan [promethazine hcl]; Repaglinide; and Vancomycin   Review of Systems Review of Systems ROS: Statement: All systems negative except as marked or noted in the HPI; Constitutional: Negative for fever and chills. ; ; Eyes: Negative for eye pain, redness and discharge. ; ; ENMT: Negative for ear pain, hoarseness, nasal congestion, sinus pressure and sore throat. ; ; Cardiovascular: Negative for palpitations, diaphoresis, and peripheral edema. ; ; Respiratory: +wheezing, SOB, chest tightness. Negative for cough and stridor. ; ; Gastrointestinal: +nausea, decreased PO intake. Negative for vomiting, diarrhea, abdominal pain, blood in stool, hematemesis, jaundice and rectal bleeding. . ; ; Genitourinary: Negative for dysuria, flank pain and hematuria. ; ; Musculoskeletal: Negative for back pain and neck pain. Negative for  swelling and trauma.; ; Skin: Negative for pruritus, rash, abrasions, blisters, bruising and skin lesion.; ; Neuro: Negative for headache, lightheadedness and neck stiffness. Negative for weakness, altered level of consciousness, altered mental status, extremity weakness, paresthesias, involuntary movement, seizure and syncope.       Physical Exam Updated Vital Signs BP 132/60   Pulse 72   Temp 98.3 F (36.8 C) (Oral)   Resp 20   Ht 5' 6"  (1.676 m)   Wt 113.4 kg (250 lb)   LMP 11/02/2012 (Approximate)   SpO2 92%   BMI 40.35 kg/m   Physical Exam 1845: Physical examination:  Nursing notes reviewed; Vital signs and O2 SAT reviewed;  Constitutional: Well developed, Well nourished, Well hydrated, In no acute distress; Head:  Normocephalic, atraumatic; Eyes: EOMI, PERRL, No scleral icterus; ENMT: Mouth and pharynx normal, Mucous membranes moist. Mouth and pharynx without lesions. +drain  in place right maxilla with visualized stitches intact. No drainage. No bleeding. No tonsillar exudates. No intra-oral edema. No submandibular or sublingual edema. No hoarse voice, no drooling, no stridor.  No trismus.; Neck: Supple, Full range of motion, No meningeal signs.; Cardiovascular: Regular rate and rhythm, No gallop; Respiratory: Breath sounds clear & equal bilaterally, scattered exp wheezes. No audible wheezing. Speaking full sentences with ease, Normal respiratory effort/excursion; Chest: Nontender, Movement normal; Abdomen: Soft, Nontender, Nondistended, Normal bowel sounds; Genitourinary: No CVA tenderness; Extremities: Peripheral pulses normal, No tenderness, No edema, No calf edema or asymmetry.; Neuro: AA&Ox3, Major CN grossly intact.  Speech clear. No gross focal motor or sensory deficits in extremities.; Skin: Color normal, Warm, Dry.   ED Treatments / Results  Labs (all labs ordered are listed, but only abnormal results are displayed)   EKG EKG Interpretation  Date/Time:  Saturday May 11 2018 19:25:21 EDT Ventricular Rate:  57 PR Interval:    QRS Duration: 106 QT Interval:  458 QTC Calculation: 446 R Axis:   90 Text Interpretation:  Sinus rhythm Borderline right axis deviation Low voltage, precordial leads Probable anteroseptal infarct, old Baseline wander When compared with ECG of 03/04/2018 No significant change was found Confirmed by Francine Graven [phone]) on 05/11/2018 7:42:04 PM   Radiology   Procedures Procedures (including critical care time)  Medications Ordered in ED Medications  potassium chloride SA (K-DUR,KLOR-CON) CR tablet 40 mEq (has no administration in time range)  furosemide (LASIX) injection 20 mg (has no administration in time range)  ondansetron (ZOFRAN-ODT) disintegrating tablet 4 mg (4 mg Oral Given 05/11/18 1737)  ipratropium-albuterol (DUONEB) 0.5-2.5 (3) MG/3ML nebulizer solution 3 mL (3 mLs Nebulization Given 05/11/18 1923)  albuterol (PROVENTIL) (2.5 MG/3ML) 0.083% nebulizer solution 2.5 mg (2.5 mg Nebulization Given 05/11/18 1923)  sodium chloride 0.9 % bolus 1,000 mL (0 mLs Intravenous Stopped 05/11/18 2031)  oxyCODONE-acetaminophen (PERCOCET/ROXICET) 5-325 MG per tablet 1 tablet (1 tablet Oral Given 05/11/18 1922)     Initial Impression / Assessment and Plan / ED Course  I have reviewed the triage vital signs and the nursing notes.  Pertinent labs & imaging results that were available during my care of the patient were reviewed by me and considered in my medical decision making (see chart for details).  MDM Reviewed: nursing note, previous chart and vitals Reviewed previous: labs and ECG Interpretation: labs, ECG and x-ray   Results for orders placed or performed during the hospital encounter of 05/11/18  Comprehensive metabolic panel  Result Value Ref Range   Sodium 137 135 - 145 mmol/L   Potassium 3.4 (L) 3.5 - 5.1 mmol/L   Chloride 103 101 - 111 mmol/L   CO2 25 22 - 32 mmol/L   Glucose, Bld 140 (H) 65 - 99 mg/dL   BUN 9 6 -  20 mg/dL   Creatinine, Ser 0.57 0.44 - 1.00 mg/dL   Calcium 8.4 (L) 8.9 - 10.3 mg/dL   Total Protein 7.1 6.5 - 8.1 g/dL   Albumin 3.2 (L) 3.5 - 5.0 g/dL   AST 13 (L) 15 - 41 U/L   ALT 11 (L) 14 - 54 U/L   Alkaline Phosphatase 67 38 - 126 U/L   Total Bilirubin 0.4 0.3 - 1.2 mg/dL   GFR calc non Af Amer >60 >60 mL/min   GFR calc Af Amer >60 >60 mL/min   Anion gap 9 5 - 15  Lipase, blood  Result Value Ref Range   Lipase 26 11 - 51 U/L  CBC with Differential  Result Value Ref Range   WBC 8.8 4.0 - 10.5 K/uL   RBC 4.05 3.87 - 5.11 MIL/uL   Hemoglobin 10.4 (L) 12.0 - 15.0 g/dL   HCT 33.5 (L) 36.0 - 46.0 %   MCV 82.7 78.0 - 100.0 fL   MCH 25.7 (L) 26.0 - 34.0 pg   MCHC 31.0 30.0 - 36.0 g/dL   RDW 17.9 (H) 11.5 - 15.5 %   Platelets 315 150 - 400 K/uL   Neutrophils Relative % 56 %   Neutro Abs 5.0 1.7 - 7.7 K/uL   Lymphocytes Relative 37 %   Lymphs Abs 3.3 0.7 - 4.0 K/uL   Monocytes Relative 6 %   Monocytes Absolute 0.5 0.1 - 1.0 K/uL   Eosinophils Relative 1 %   Eosinophils Absolute 0.1 0.0 - 0.7 K/uL   Basophils Relative 0 %   Basophils Absolute 0.0 0.0 - 0.1 K/uL  Urinalysis, Routine w reflex microscopic  Result Value Ref Range   Color, Urine YELLOW YELLOW   APPearance HAZY (A) CLEAR   Specific Gravity, Urine 1.029 1.005 - 1.030   pH 8.0 5.0 - 8.0   Glucose, UA NEGATIVE NEGATIVE mg/dL   Hgb urine dipstick NEGATIVE NEGATIVE   Bilirubin Urine NEGATIVE NEGATIVE   Ketones, ur 5 (A) NEGATIVE mg/dL   Protein, ur 30 (A) NEGATIVE mg/dL   Nitrite NEGATIVE NEGATIVE   Leukocytes, UA NEGATIVE NEGATIVE   RBC / HPF 0-5 0 - 5 RBC/hpf   WBC, UA 0-5 0 - 5 WBC/hpf   Bacteria, UA NONE SEEN NONE SEEN   Squamous Epithelial / LPF 6-10 0 - 5   Mucus PRESENT   Troponin I  Result Value Ref Range   Troponin I <0.03 <0.03 ng/mL  Brain natriuretic peptide  Result Value Ref Range   B Natriuretic Peptide 246.0 (H) 0.0 - 100.0 pg/mL   Dg Chest 2 View Result Date: 05/11/2018 CLINICAL DATA:   Headache and shortness of breath. EXAM: CHEST - 2 VIEW COMPARISON:  03/04/2018 FINDINGS: The heart size and mediastinal contours are within normal limits. Pulmonary vascular congestion is new from previous exam. Both lungs are clear. Previous right shoulder arthroplasty. IMPRESSION: 1. Pulmonary vascular congestion.  New from previous exam. Electronically Signed   By: Kerby Moors M.D.   On: 05/11/2018 19:25     2130:  Pt given one short neb with improvement in wheezing; lungs CTA bilat, Sats 99% R/A at rest and 100% R/A on ambulation around nurses' station. Will hold steroids at this time, as pt is still healing from oral surgery and pt's symptoms improved after one duoneb. Potassium repleted PO. Pt has tol PO well while in the ED without N/V. Abd remains benign.  H/H per baseline. BNP mildly elevated with no old to compare. CXR has pulm vasc congestion; will give small dose lasix while in the ED. Troponin and EKG are reassuring. Doubt PE as cause for symptoms with pt endorsing compliance with xarelto.  Doubt ACS as cause for symptoms with normal troponin and unchanged EKG from previous after 2 days of constant symptoms.  Pt wants to go home now and is requesting discharge. No clear indication for admission at this time. Pt will need f/u with PMD or Cards MD for echocardiogram. Dx and testing d/w pt.  Questions answered.  Verb understanding, agreeable to d/c home with outpt f/u.    Final Clinical Impressions(s) / ED Diagnoses   Final diagnoses:  None    ED  Discharge Orders    None       Francine Graven, [place] 05/16/18 1619

## 2018-05-11 NOTE — ED Notes (Signed)
Patient ambulated around nursing station and oxygen remained 100% on room air.

## 2018-05-11 NOTE — ED Notes (Signed)
C/o nausea today.  States she has not eat anything and had very little to drink today. Also c/o not being able to take a deep breath and some chest pain.

## 2018-05-13 LAB — URINE CULTURE: Culture: >=100000 — AB

## 2018-05-14 ENCOUNTER — Telehealth: Payer: Self-pay | Admitting: *Deleted

## 2018-05-14 NOTE — Telephone Encounter (Signed)
Post ED Visit - Positive Culture Follow-up  Culture report reviewed by antimicrobial stewardship pharmacist:  []  Elenor Quinones, Pharm.D. []  Heide Guile, Pharm.D., BCPS AQ-ID []  Parks Neptune, Pharm.D., BCPS []  Alycia Rossetti, Pharm.D., BCPS []  [place], [place].D., BCPS, AAHIVP []  Legrand Como, Pharm.D., BCPS, AAHIVP []  Salome Arnt, PharmD, BCPS []  Wynell Balloon, PharmD []  Vincenza Hews, PharmD, BCPS Angus Seller, PharmD  Positive  urine culture No further patient follow-up is required at this time.  Harlon Flor [facility] 05/14/2018, 12:38 PM

## 2018-07-26 IMAGING — DX DG CHEST 2V
2 series · 2 of 2 positions shown · non-contrast
Comparison: None

CLINICAL DATA: Right ankle and foot swelling for 2 days, history
asthma, COPD, hypertension, diabetes mellitus, smoker

EXAM:
CHEST - 2 VIEW

[chest lat]
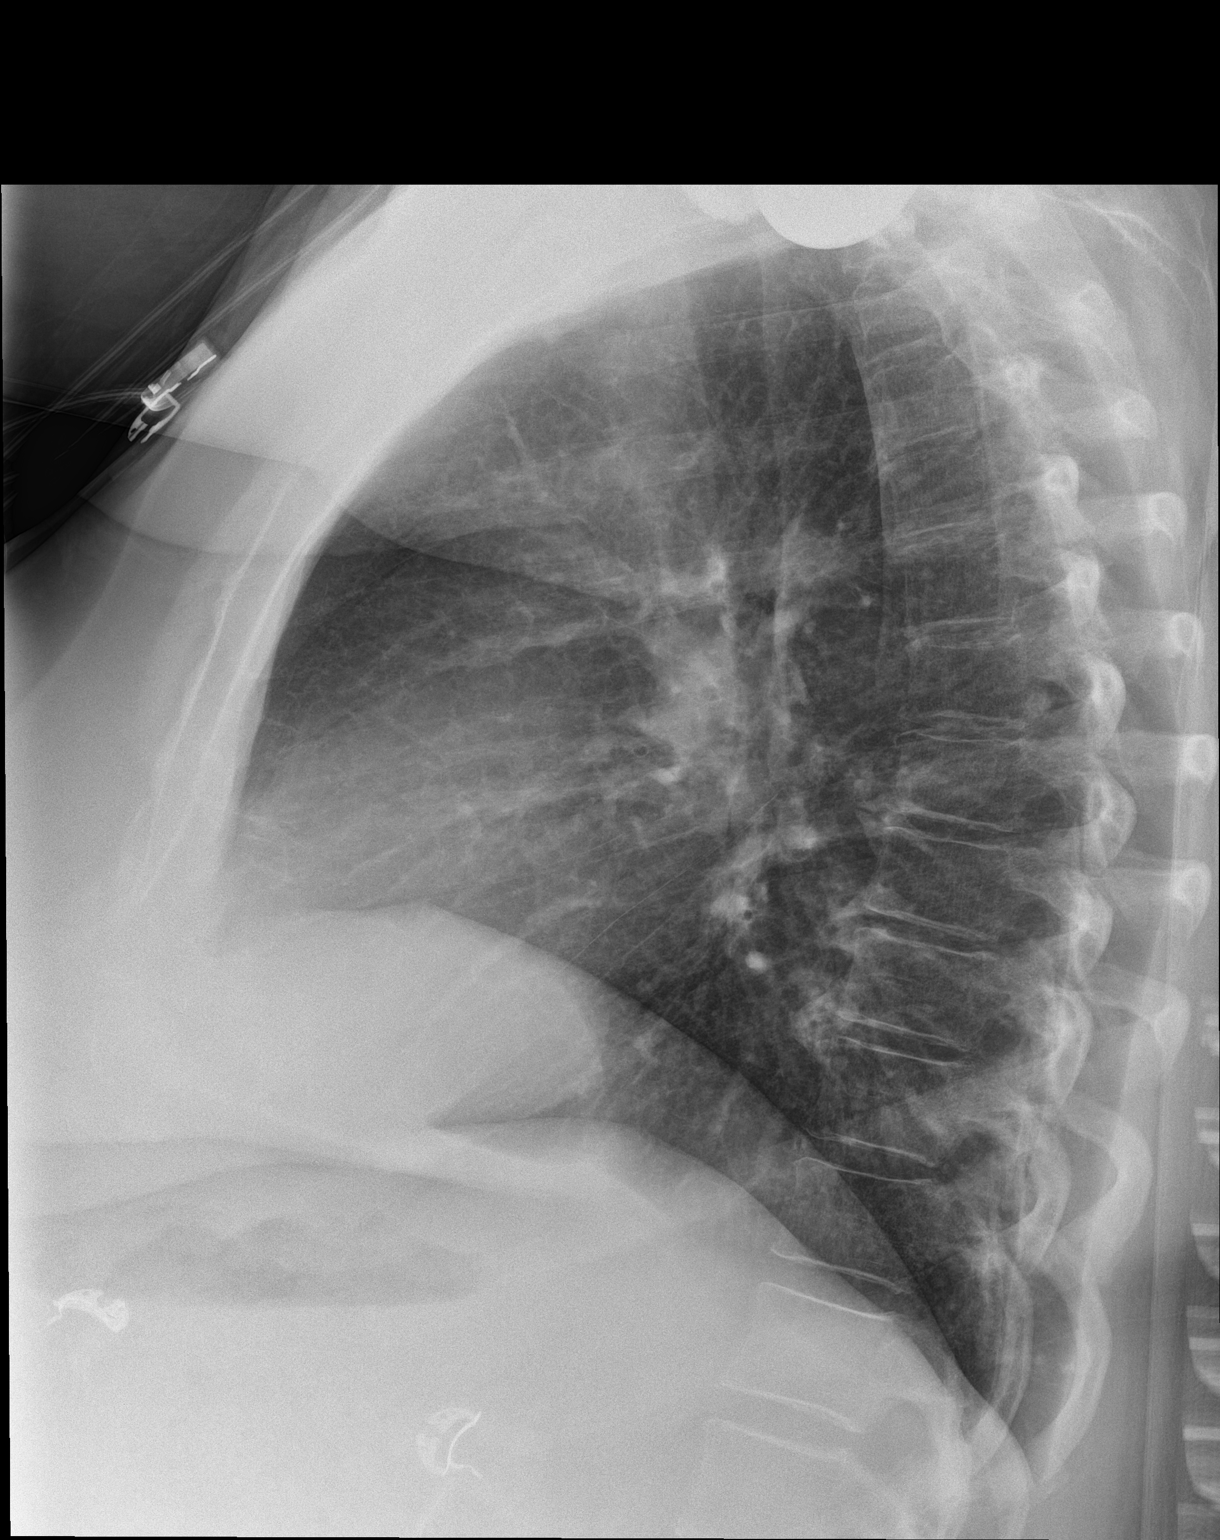

[chest ap]
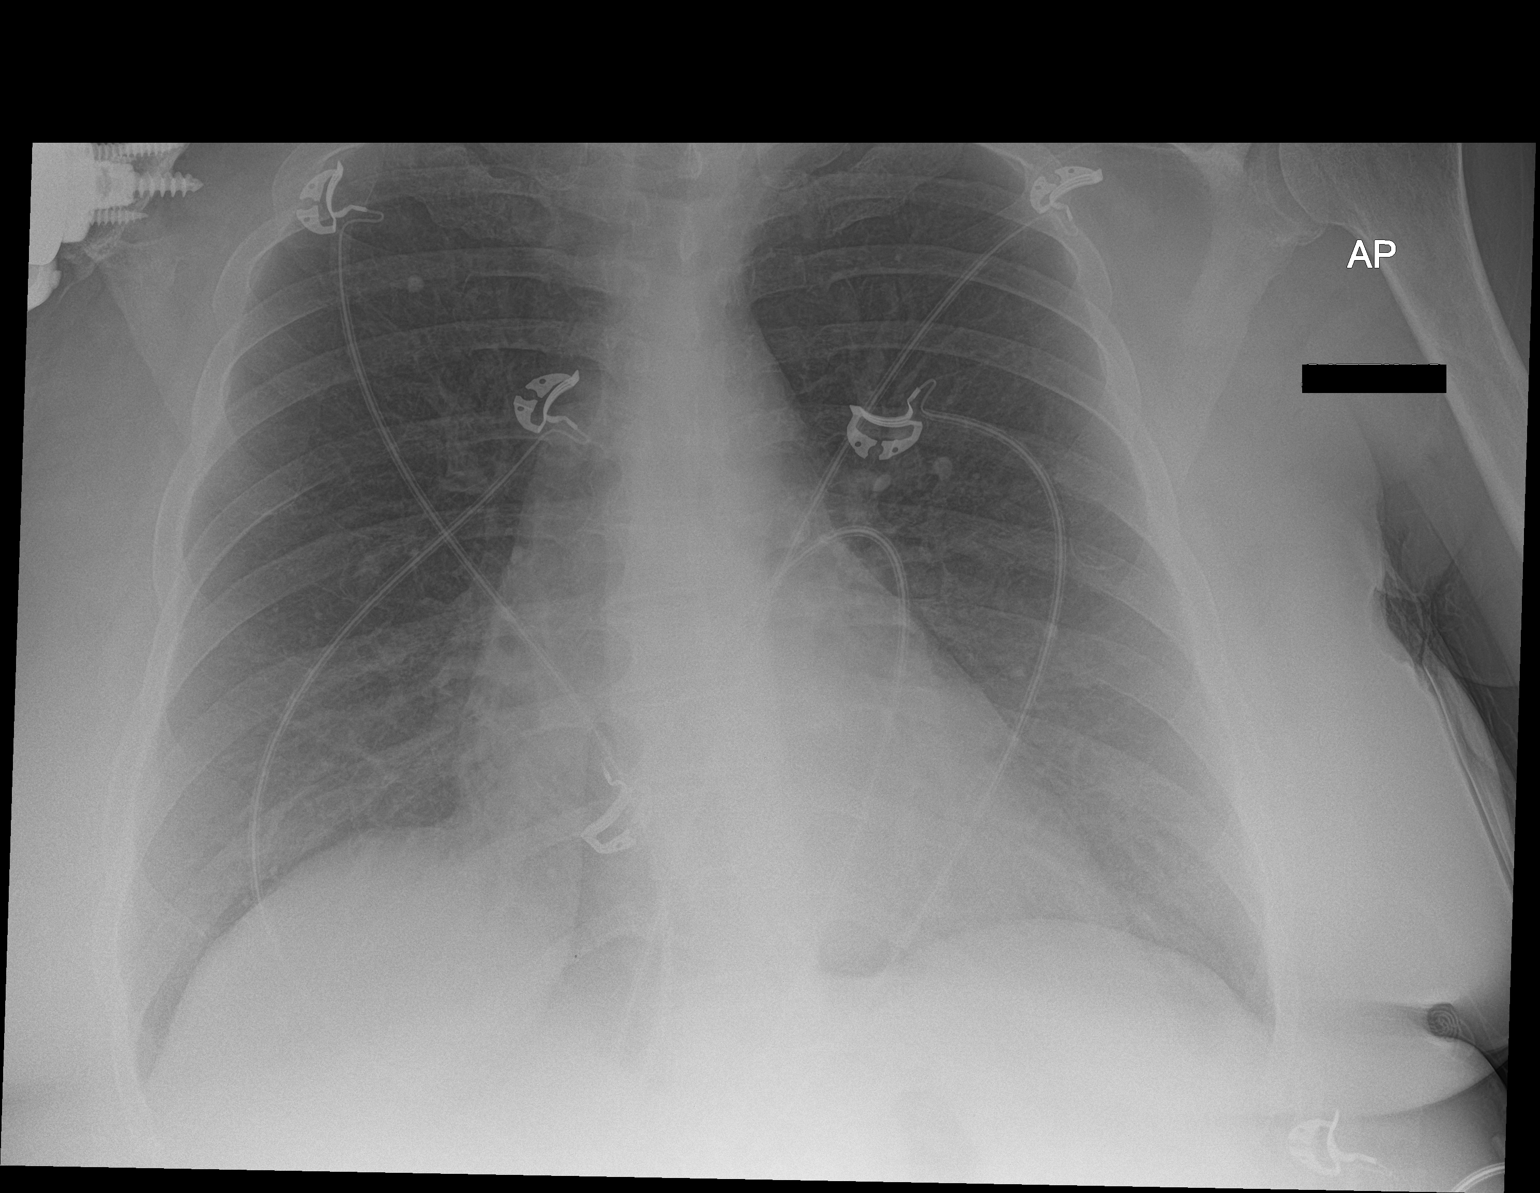

[2 of 2 positions shown; findings below may reference images not displayed]

FINDINGS: Upper normal heart size.

Mediastinal contours and pulmonary vascularity normal.

Calcified granuloma RIGHT upper lobe.

Lungs otherwise clear.

No infiltrate, pleural effusion or pneumothorax.

RIGHT shoulder prosthesis.
IMPRESSION: No acute abnormalities.

## 2018-07-26 IMAGING — DX DG ANKLE COMPLETE 3+V*R*
3 series · 3 of 3 positions shown · non-contrast
Comparison: None

CLINICAL DATA: RIGHT foot and ankle swelling for 2 days, RIGHT
ankle surgery several years ago

EXAM:
RIGHT ANKLE - COMPLETE 3+ VIEW

[ankle ap]
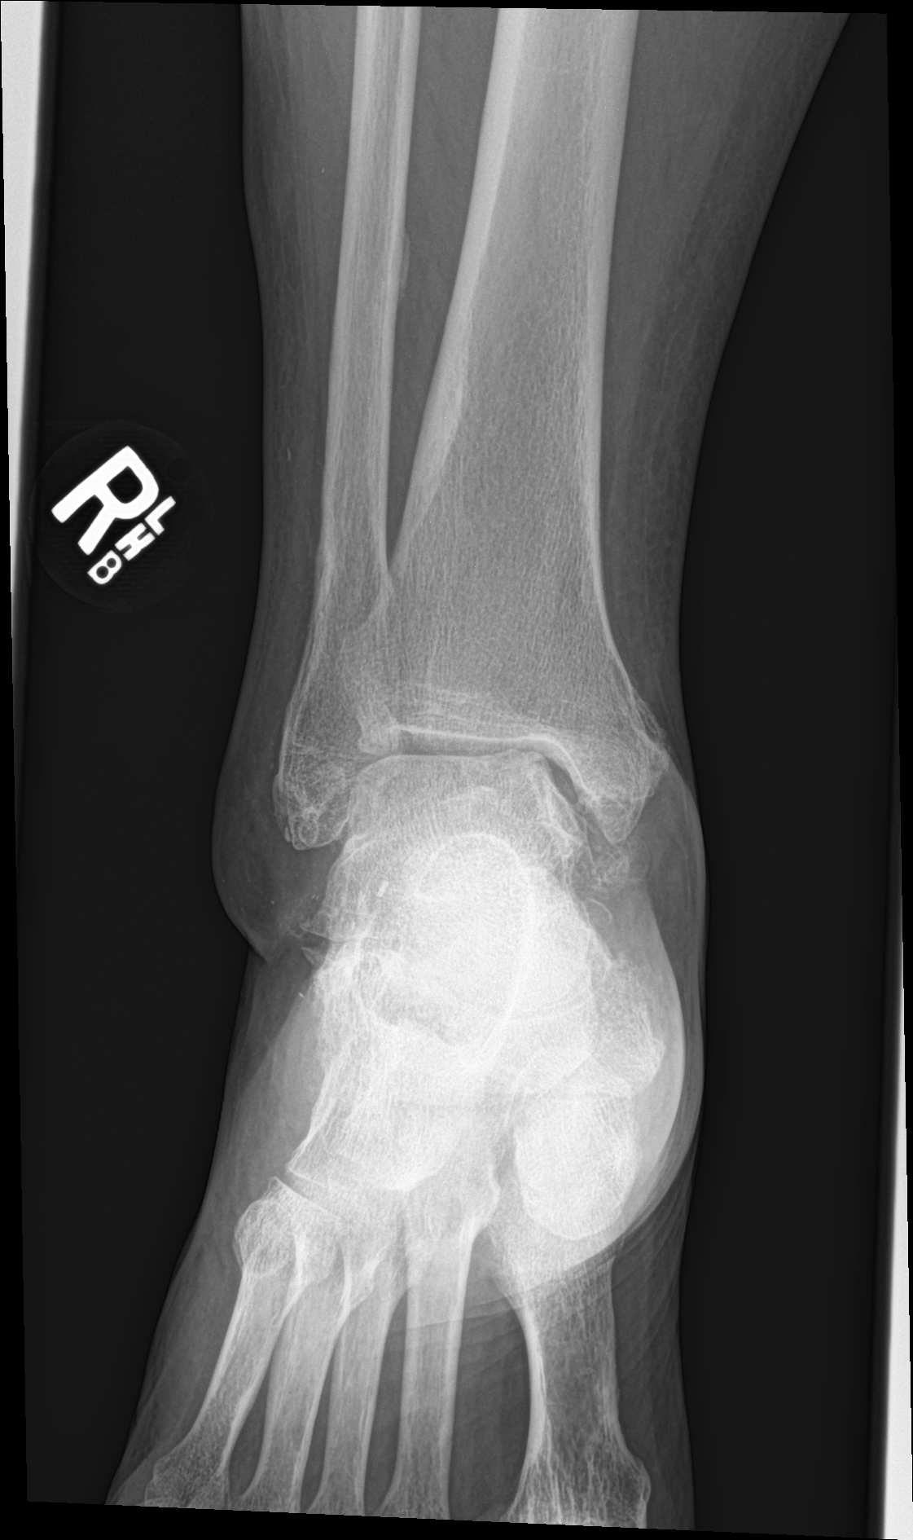

[ankle obl]
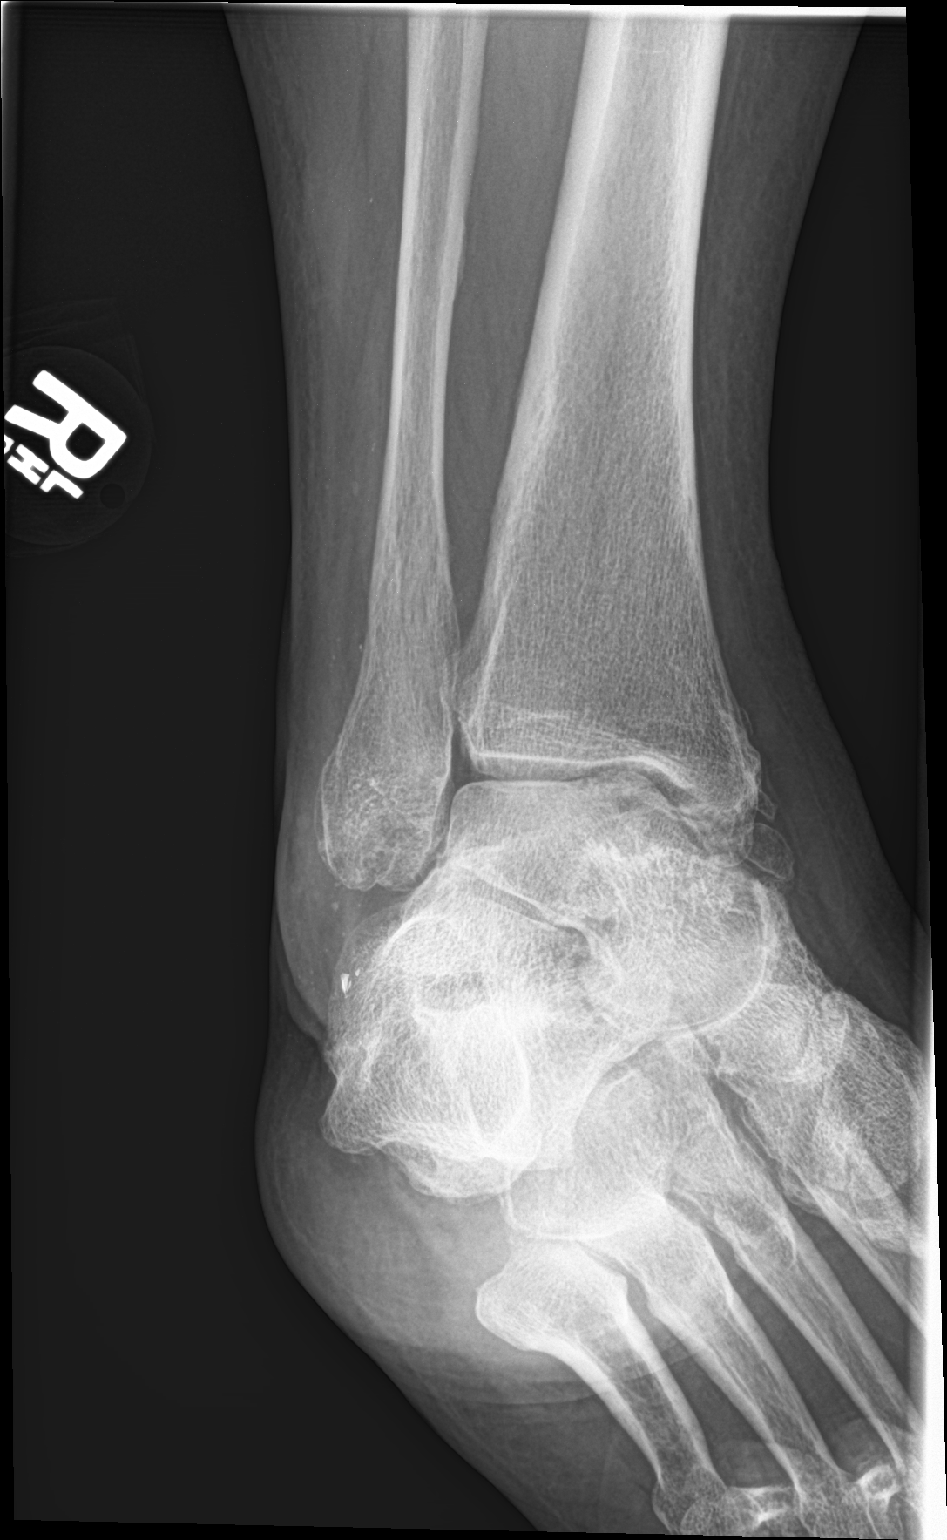

[ankle lat]
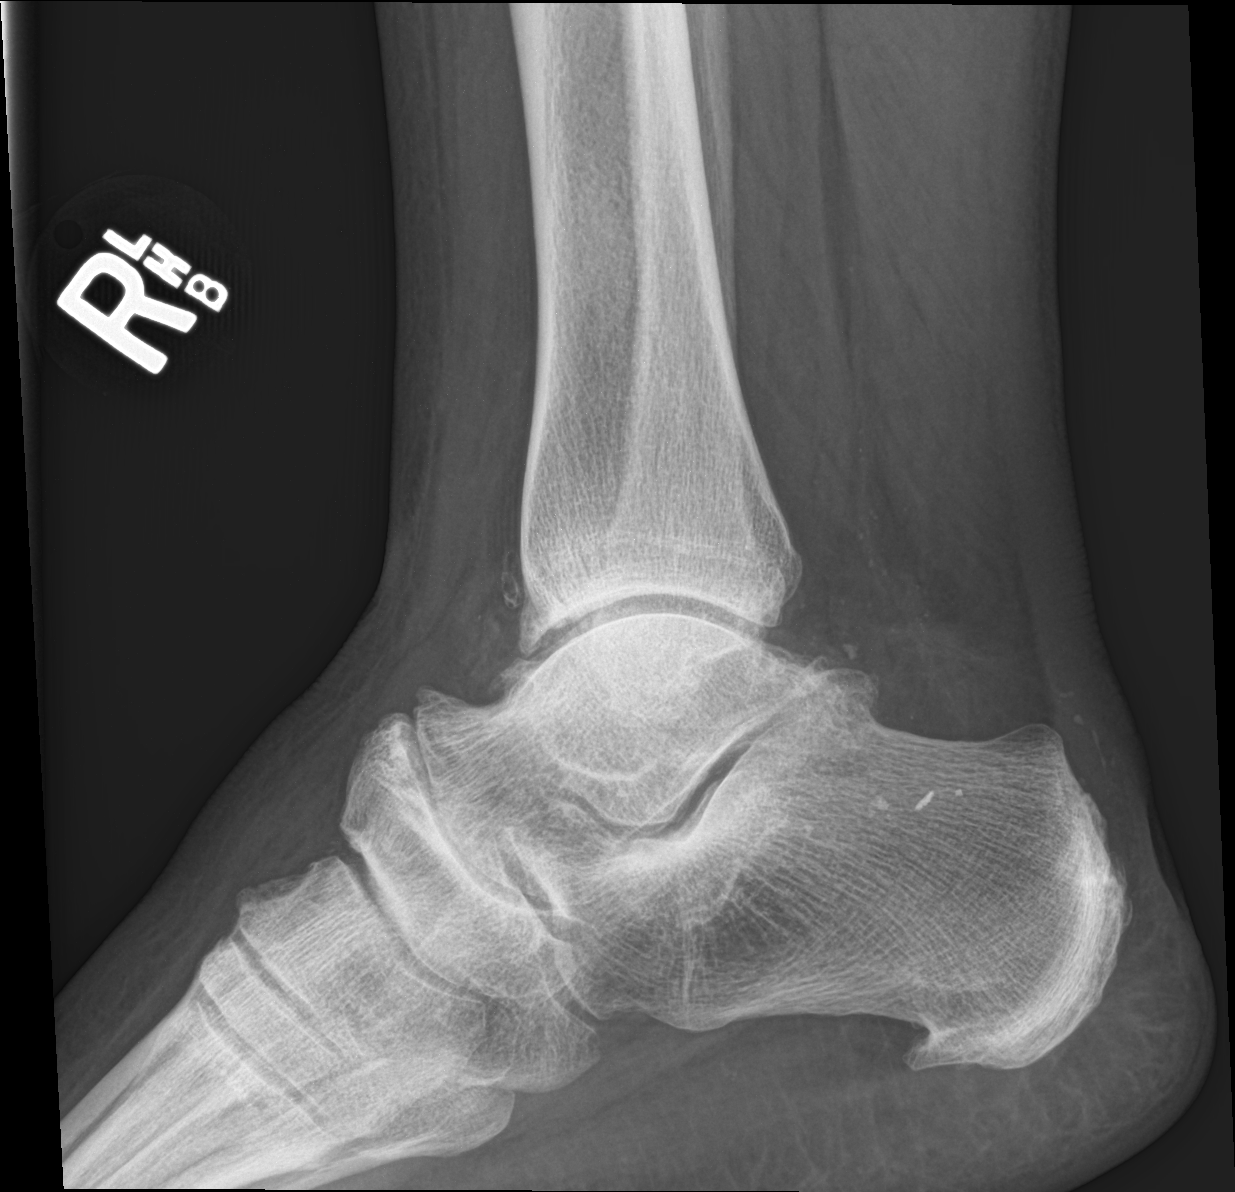

[3 of 3 positions shown; findings below may reference images not displayed]

FINDINGS: Scattered soft tissue swelling.

Mild medial narrowing and lateral widening of ankle joint compatible
with degenerative changes.

Old calcified ossicles and small spurs at the medial malleolus.

Talonavicular degenerative changes.

Plantar calcaneal spur.

Small metallic foreign body at lateral ankle.

No acute fracture, dislocation, or bone destruction.
IMPRESSION: Old posttraumatic and degenerative changes of the RIGHT foot.

No acute abnormalities.
